# Patient Record
Sex: Male | Born: 1940
Health system: Southern US, Community
[De-identification: ages and names within clinical notes are randomized; demographics above are authoritative.]

## PROBLEM LIST (undated history)

## (undated) DIAGNOSIS — Z8739 Personal history of other diseases of the musculoskeletal system and connective tissue: Secondary | ICD-10-CM

## (undated) DIAGNOSIS — I10 Essential (primary) hypertension: Secondary | ICD-10-CM

## (undated) DIAGNOSIS — Z87442 Personal history of urinary calculi: Secondary | ICD-10-CM

## (undated) DIAGNOSIS — Z95810 Presence of automatic (implantable) cardiac defibrillator: Secondary | ICD-10-CM

## (undated) DIAGNOSIS — E781 Pure hyperglyceridemia: Secondary | ICD-10-CM

## (undated) DIAGNOSIS — E119 Type 2 diabetes mellitus without complications: Secondary | ICD-10-CM

## (undated) DIAGNOSIS — E785 Hyperlipidemia, unspecified: Secondary | ICD-10-CM

## (undated) DIAGNOSIS — M1990 Unspecified osteoarthritis, unspecified site: Secondary | ICD-10-CM

## (undated) DIAGNOSIS — I639 Cerebral infarction, unspecified: Secondary | ICD-10-CM

## (undated) HISTORY — DX: Pure hyperglyceridemia: E78.1

## (undated) HISTORY — PX: INGUINAL HERNIA REPAIR: SUR1180

## (undated) HISTORY — DX: Type 2 diabetes mellitus without complications: E11.9

## (undated) HISTORY — PX: APPENDECTOMY: SHX54

## (undated) HISTORY — DX: Cerebral infarction, unspecified: I63.9

## (undated) HISTORY — PX: CARDIAC CATHETERIZATION: SHX172

## (undated) HISTORY — PX: COLONOSCOPY: SHX174

---

## 1993-06-22 HISTORY — PX: FLEXIBLE SIGMOIDOSCOPY: SHX1649

## 2002-02-23 ENCOUNTER — Encounter: Admission: RE | Admit: 2002-02-23 | Discharge: 2002-02-23 | Payer: Self-pay | Admitting: General Surgery

## 2002-02-23 ENCOUNTER — Encounter: Payer: Self-pay | Admitting: General Surgery

## 2015-02-17 DIAGNOSIS — R42 Dizziness and giddiness: Secondary | ICD-10-CM | POA: Diagnosis not present

## 2015-02-17 DIAGNOSIS — R001 Bradycardia, unspecified: Secondary | ICD-10-CM | POA: Diagnosis not present

## 2015-02-27 DIAGNOSIS — Z79899 Other long term (current) drug therapy: Secondary | ICD-10-CM | POA: Diagnosis not present

## 2015-02-27 DIAGNOSIS — E782 Mixed hyperlipidemia: Secondary | ICD-10-CM | POA: Diagnosis not present

## 2015-02-27 DIAGNOSIS — Z1329 Encounter for screening for other suspected endocrine disorder: Secondary | ICD-10-CM | POA: Diagnosis not present

## 2015-03-07 DIAGNOSIS — R001 Bradycardia, unspecified: Secondary | ICD-10-CM | POA: Diagnosis not present

## 2015-03-07 DIAGNOSIS — I10 Essential (primary) hypertension: Secondary | ICD-10-CM | POA: Diagnosis not present

## 2015-03-07 DIAGNOSIS — E785 Hyperlipidemia, unspecified: Secondary | ICD-10-CM | POA: Diagnosis not present

## 2015-03-26 DIAGNOSIS — R001 Bradycardia, unspecified: Secondary | ICD-10-CM | POA: Diagnosis not present

## 2015-03-26 DIAGNOSIS — R42 Dizziness and giddiness: Secondary | ICD-10-CM | POA: Diagnosis not present

## 2015-03-27 DIAGNOSIS — R001 Bradycardia, unspecified: Secondary | ICD-10-CM | POA: Diagnosis not present

## 2015-04-09 DIAGNOSIS — R001 Bradycardia, unspecified: Secondary | ICD-10-CM | POA: Diagnosis not present

## 2015-04-09 DIAGNOSIS — I493 Ventricular premature depolarization: Secondary | ICD-10-CM | POA: Diagnosis not present

## 2015-08-04 DIAGNOSIS — T63444A Toxic effect of venom of bees, undetermined, initial encounter: Secondary | ICD-10-CM | POA: Diagnosis not present

## 2015-08-28 DIAGNOSIS — Z23 Encounter for immunization: Secondary | ICD-10-CM | POA: Diagnosis not present

## 2015-11-06 DIAGNOSIS — J209 Acute bronchitis, unspecified: Secondary | ICD-10-CM | POA: Diagnosis not present

## 2015-12-03 DIAGNOSIS — Z1389 Encounter for screening for other disorder: Secondary | ICD-10-CM | POA: Diagnosis not present

## 2015-12-03 DIAGNOSIS — Z79899 Other long term (current) drug therapy: Secondary | ICD-10-CM | POA: Diagnosis not present

## 2015-12-03 DIAGNOSIS — Z9181 History of falling: Secondary | ICD-10-CM | POA: Diagnosis not present

## 2015-12-03 DIAGNOSIS — E782 Mixed hyperlipidemia: Secondary | ICD-10-CM | POA: Diagnosis not present

## 2015-12-03 DIAGNOSIS — Z0000 Encounter for general adult medical examination without abnormal findings: Secondary | ICD-10-CM | POA: Diagnosis not present

## 2016-01-16 DIAGNOSIS — J111 Influenza due to unidentified influenza virus with other respiratory manifestations: Secondary | ICD-10-CM | POA: Diagnosis not present

## 2016-07-29 DIAGNOSIS — Z79899 Other long term (current) drug therapy: Secondary | ICD-10-CM | POA: Diagnosis not present

## 2016-07-29 DIAGNOSIS — Z0000 Encounter for general adult medical examination without abnormal findings: Secondary | ICD-10-CM | POA: Diagnosis not present

## 2016-07-29 DIAGNOSIS — S51831A Puncture wound without foreign body of right forearm, initial encounter: Secondary | ICD-10-CM | POA: Diagnosis not present

## 2016-07-29 DIAGNOSIS — E785 Hyperlipidemia, unspecified: Secondary | ICD-10-CM | POA: Diagnosis not present

## 2016-07-29 DIAGNOSIS — E119 Type 2 diabetes mellitus without complications: Secondary | ICD-10-CM | POA: Diagnosis not present

## 2016-09-09 DIAGNOSIS — H5203 Hypermetropia, bilateral: Secondary | ICD-10-CM | POA: Diagnosis not present

## 2016-12-03 DIAGNOSIS — E782 Mixed hyperlipidemia: Secondary | ICD-10-CM | POA: Diagnosis not present

## 2016-12-03 DIAGNOSIS — Z125 Encounter for screening for malignant neoplasm of prostate: Secondary | ICD-10-CM | POA: Diagnosis not present

## 2016-12-03 DIAGNOSIS — Z79899 Other long term (current) drug therapy: Secondary | ICD-10-CM | POA: Diagnosis not present

## 2016-12-03 DIAGNOSIS — Z9181 History of falling: Secondary | ICD-10-CM | POA: Diagnosis not present

## 2016-12-03 DIAGNOSIS — I10 Essential (primary) hypertension: Secondary | ICD-10-CM | POA: Diagnosis not present

## 2017-02-10 DIAGNOSIS — S7001XA Contusion of right hip, initial encounter: Secondary | ICD-10-CM | POA: Diagnosis not present

## 2017-06-10 DIAGNOSIS — H6123 Impacted cerumen, bilateral: Secondary | ICD-10-CM | POA: Diagnosis not present

## 2017-06-10 DIAGNOSIS — M109 Gout, unspecified: Secondary | ICD-10-CM | POA: Diagnosis not present

## 2017-08-29 ENCOUNTER — Other Ambulatory Visit: Payer: Self-pay

## 2017-08-29 NOTE — Patient Outreach (Signed)
[facility]) Care Management  08/29/2017  WALDRON GERRY June 04, 1941 [id number]   Medication Adherence call to Mr. Maxden Naji the reason for this cal is because Mr. Cranston is showing past due under [place] [employer].on his captopril 50 mg and atorvastatin 40 mg spoke to patient he said he still has medication and he does not need any medication at this time.   [place] Management Direct Dial [phone]  Fax [phone] [email or web address]

## 2017-09-29 DIAGNOSIS — H5203 Hypermetropia, bilateral: Secondary | ICD-10-CM | POA: Diagnosis not present

## 2017-09-29 DIAGNOSIS — H2513 Age-related nuclear cataract, bilateral: Secondary | ICD-10-CM | POA: Diagnosis not present

## 2017-10-05 ENCOUNTER — Observation Stay (HOSPITAL_COMMUNITY): Payer: Medicare Other

## 2017-10-05 ENCOUNTER — Encounter (HOSPITAL_COMMUNITY): Payer: Self-pay | Admitting: Emergency Medicine

## 2017-10-05 ENCOUNTER — Inpatient Hospital Stay (HOSPITAL_COMMUNITY)
Admission: EM | Admit: 2017-10-05 | Discharge: 2017-10-07 | DRG: 041 | Disposition: A | Discharge status: Home / Self Care | Payer: Medicare Other | Attending: Family Medicine | Admitting: Family Medicine

## 2017-10-05 ENCOUNTER — Emergency Department (HOSPITAL_COMMUNITY): Payer: Medicare Other

## 2017-10-05 DIAGNOSIS — H547 Unspecified visual loss: Secondary | ICD-10-CM | POA: Diagnosis not present

## 2017-10-05 DIAGNOSIS — H53132 Sudden visual loss, left eye: Secondary | ICD-10-CM

## 2017-10-05 DIAGNOSIS — H5340 Unspecified visual field defects: Secondary | ICD-10-CM | POA: Diagnosis not present

## 2017-10-05 DIAGNOSIS — Z79899 Other long term (current) drug therapy: Secondary | ICD-10-CM | POA: Diagnosis not present

## 2017-10-05 DIAGNOSIS — I10 Essential (primary) hypertension: Secondary | ICD-10-CM | POA: Diagnosis not present

## 2017-10-05 DIAGNOSIS — H5462 Unqualified visual loss, left eye, normal vision right eye: Secondary | ICD-10-CM | POA: Diagnosis present

## 2017-10-05 DIAGNOSIS — I6389 Other cerebral infarction: Secondary | ICD-10-CM | POA: Diagnosis not present

## 2017-10-05 DIAGNOSIS — I63412 Cerebral infarction due to embolism of left middle cerebral artery: Secondary | ICD-10-CM | POA: Diagnosis not present

## 2017-10-05 DIAGNOSIS — I6789 Other cerebrovascular disease: Secondary | ICD-10-CM | POA: Diagnosis not present

## 2017-10-05 DIAGNOSIS — H34212 Partial retinal artery occlusion, left eye: Secondary | ICD-10-CM | POA: Diagnosis not present

## 2017-10-05 DIAGNOSIS — H1010 Acute atopic conjunctivitis, unspecified eye: Secondary | ICD-10-CM | POA: Diagnosis present

## 2017-10-05 DIAGNOSIS — H34232 Retinal artery branch occlusion, left eye: Secondary | ICD-10-CM | POA: Diagnosis present

## 2017-10-05 DIAGNOSIS — I639 Cerebral infarction, unspecified: Secondary | ICD-10-CM

## 2017-10-05 DIAGNOSIS — Z7982 Long term (current) use of aspirin: Secondary | ICD-10-CM | POA: Diagnosis not present

## 2017-10-05 DIAGNOSIS — R29701 NIHSS score 1: Secondary | ICD-10-CM | POA: Diagnosis present

## 2017-10-05 DIAGNOSIS — I6523 Occlusion and stenosis of bilateral carotid arteries: Secondary | ICD-10-CM | POA: Diagnosis not present

## 2017-10-05 DIAGNOSIS — E785 Hyperlipidemia, unspecified: Secondary | ICD-10-CM | POA: Diagnosis present

## 2017-10-05 HISTORY — DX: Personal history of other diseases of the musculoskeletal system and connective tissue: Z87.39

## 2017-10-05 HISTORY — DX: Hyperlipidemia, unspecified: E78.5

## 2017-10-05 HISTORY — DX: Essential (primary) hypertension: I10

## 2017-10-05 HISTORY — DX: Personal history of urinary calculi: Z87.442

## 2017-10-05 LAB — COMPREHENSIVE METABOLIC PANEL
ALBUMIN: 4 g/dL (ref 3.5–5.0)
ALK PHOS: 77 U/L (ref 38–126)
ALT: 32 U/L (ref 17–63)
AST: 36 U/L (ref 15–41)
Anion gap: 6 (ref 5–15)
BILIRUBIN TOTAL: 0.7 mg/dL (ref 0.3–1.2)
BUN: 12 mg/dL (ref 6–20)
CALCIUM: 9 mg/dL (ref 8.9–10.3)
CO2: 26 mmol/L (ref 22–32)
Chloride: 104 mmol/L (ref 101–111)
Creatinine, Ser: 1.23 mg/dL (ref 0.61–1.24)
GFR calc Af Amer: >60 mL/min (ref >60)
GFR calc non Af Amer: 55 mL/min — ABNORMAL LOW (ref >60)
GLUCOSE: 103 mg/dL — AB (ref 65–99)
Potassium: 4.2 mmol/L (ref 3.5–5.1)
Sodium: 136 mmol/L (ref 135–145)
TOTAL PROTEIN: 7 g/dL (ref 6.5–8.1)

## 2017-10-05 LAB — CBC WITH DIFFERENTIAL/PLATELET
BASOS ABS: 0.1 10*3/uL (ref 0.0–0.1)
BASOS PCT: 1 %
Eosinophils Absolute: 0.1 10*3/uL (ref 0.0–0.7)
Eosinophils Relative: 1 %
HEMATOCRIT: 43.1 % (ref 39.0–52.0)
HEMOGLOBIN: 14.4 g/dL (ref 13.0–17.0)
Lymphocytes Relative: 39 %
Lymphs Abs: 2.2 10*3/uL (ref 0.7–4.0)
MCH: 32.1 pg (ref 26.0–34.0)
MCHC: 33.4 g/dL (ref 30.0–36.0)
MCV: 96.2 fL (ref 78.0–100.0)
MONOS PCT: 7 %
Monocytes Absolute: 0.4 10*3/uL (ref 0.1–1.0)
NEUTROS ABS: 2.9 10*3/uL (ref 1.7–7.7)
NEUTROS PCT: 52 %
Platelets: 193 10*3/uL (ref 150–400)
RBC: 4.48 MIL/uL (ref 4.22–5.81)
RDW: 13 % (ref 11.5–15.5)
WBC: 5.6 10*3/uL (ref 4.0–10.5)

## 2017-10-05 LAB — SEDIMENTATION RATE: Sed Rate: 0 mm/hr (ref 0–16)

## 2017-10-05 MED ORDER — ACETAMINOPHEN 325 MG PO TABS: 650.0000 mg | ORAL_TABLET | ORAL | Status: DC | PRN

## 2017-10-05 MED ORDER — OMEGA-3-ACID ETHYL ESTERS 1 G PO CAPS
1.0000 g | ORAL_CAPSULE | Freq: Two times a day (BID) | ORAL | Status: DC
Start: 2017-10-05 — End: 2017-10-07
  Administered 2017-10-05 – 2017-10-07 (×5): 1 g via ORAL
  Filled 2017-10-05 (×6): qty 1

## 2017-10-05 MED ORDER — ATORVASTATIN CALCIUM 40 MG PO TABS
40.0000 mg | ORAL_TABLET | Freq: Every day | ORAL | Status: DC
  Administered 2017-10-05 – 2017-10-07 (×3): 40 mg via ORAL
  Filled 2017-10-05 (×3): qty 1

## 2017-10-05 MED ORDER — IOPAMIDOL (ISOVUE-370) INJECTION 76%
INTRAVENOUS | Status: AC
  Administered 2017-10-05: 50 mL
  Filled 2017-10-05: qty 50

## 2017-10-05 MED ORDER — IOPAMIDOL (ISOVUE-370) INJECTION 76%
INTRAVENOUS | Status: AC
  Filled 2017-10-05: qty 50

## 2017-10-05 MED ORDER — ASPIRIN 325 MG PO TABS
325.0000 mg | ORAL_TABLET | Freq: Every day | ORAL | Status: DC
  Administered 2017-10-05 – 2017-10-06 (×2): 325 mg via ORAL
  Filled 2017-10-05 (×2): qty 1

## 2017-10-05 MED ORDER — KETOROLAC TROMETHAMINE 0.5 % OP SOLN
1.0000 [drp] | Freq: Four times a day (QID) | OPHTHALMIC | Status: DC
  Administered 2017-10-05 – 2017-10-07 (×7): 1 [drp] via OPHTHALMIC
  Filled 2017-10-05: qty 3

## 2017-10-05 MED ORDER — ACETAMINOPHEN 160 MG/5ML PO SOLN: 650.0000 mg | ORAL | Status: DC | PRN

## 2017-10-05 MED ORDER — ENOXAPARIN SODIUM 40 MG/0.4ML ~~LOC~~ SOLN
40.0000 mg | SUBCUTANEOUS | Status: DC
  Administered 2017-10-06: 40 mg via SUBCUTANEOUS
  Filled 2017-10-05 (×2): qty 0.4

## 2017-10-05 MED ORDER — ACETAMINOPHEN 650 MG RE SUPP: 650.0000 mg | RECTAL | Status: DC | PRN

## 2017-10-05 MED ORDER — ASPIRIN 300 MG RE SUPP: 300.0000 mg | Freq: Every day | RECTAL | Status: DC

## 2017-10-05 MED ORDER — HYDRALAZINE HCL 20 MG/ML IJ SOLN: 10.0000 mg | Freq: Four times a day (QID) | INTRAMUSCULAR | Status: DC | PRN

## 2017-10-05 MED ORDER — STROKE: EARLY STAGES OF RECOVERY BOOK
Freq: Once | Status: AC
  Administered 2017-10-05: 1
  Filled 2017-10-05: qty 1

## 2017-10-05 MED ORDER — POLYVINYL ALCOHOL 1.4 % OP SOLN
1.0000 [drp] | Freq: Four times a day (QID) | OPHTHALMIC | Status: DC | PRN
  Filled 2017-10-05: qty 15

## 2017-10-05 NOTE — Evaluation (Addendum)
Physical Therapy Evaluation/Discharge  Patient Details Name: Zachary Calhoun MRN: [id number] DOB: [birth year] Today's Date: 10/05/2017   History of Present Illness  76 y.o. male admitted on 10/05/17 for sudden vision changes in his left eye.  CT negative for acute intracrania abnormality.  MRI pending. Pt with significant PMH of HTN and HLD.    Clinical Impression  Pt is independent with all gait and mobility including stair training.  Pt's left eye visual deficits do not seem to affect his balance or his ability to avoid left sided obstacles or do stairs safely.  At this time he has no acute PT needs or f/u needs.  PT to sign off. BEFAST stroke education completed.      Follow Up Recommendations No PT follow up    Equipment Recommendations  None recommended by PT    Recommendations for Other Services  NA     Precautions / Restrictions Precautions Precautions: None      Mobility  Bed Mobility Overal bed mobility: Independent                Transfers Overall transfer level: Independent                  Ambulation/Gait Ambulation/Gait assistance: Independent Ambulation Distance (Feet): 300 Feet Assistive device: None Gait Pattern/deviations: WFL(Within Functional Limits)   Gait velocity interpretation: at or above normal speed for age/gender    Stairs Stairs: Yes Stairs assistance: Independent Stair Management: No rails;Alternating pattern Number of Stairs: 10 General stair comments: Independent, no problems (seemingly) with depth perception or safety on the stairs.    Modified Rankin (Stroke Patients Only) Modified Rankin (Stroke Patients Only) Pre-Morbid Rankin Score: No symptoms Modified Rankin: No significant disability     Balance Overall balance assessment: Independent                               Standardized Balance Assessment Standardized Balance Assessment : Dynamic Gait Index   Dynamic Gait Index Level Surface:  Normal Change in Gait Speed: Normal Gait with Horizontal Head Turns: Normal Gait with Vertical Head Turns: Normal Gait and Pivot Turn: Normal Step Over Obstacle: Normal Step Around Obstacles: Normal Steps: Normal Total Score: 24       Pertinent Vitals/Pain Pain Assessment: No/denies pain    Home Living Family/patient expects to be discharged to:: Private residence Living Arrangements: Spouse/significant other Available Help at Discharge: Family;Available 24 hours/day Type of Home: House                Prior Function Level of Independence: Independent         Comments: he is retired from his professional job, 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       Hand Dominance   Dominant Hand: Right    Extremity/Trunk Assessment   Upper Extremity Assessment Upper Extremity Assessment: Overall WFL for tasks assessed    Lower Extremity Assessment Lower Extremity Assessment: Overall WFL for tasks assessed (strength, sensation, coordination testing WNL)    Cervical / Trunk Assessment Cervical / Trunk Assessment: Normal  Communication   Communication: No difficulties  Cognition Arousal/Alertness: Awake/alert Behavior During Therapy: WFL for tasks assessed/performed Overall Cognitive Status: Within Functional Limits for tasks assessed  General Comments General comments (skin integrity, edema, etc.): Left eye superior vision deficits.         Assessment/Plan    PT Assessment Patent does not need any further PT services         PT Goals (Current goals can be found in the Care Plan section)  Acute Rehab PT Goals PT Goal Formulation: All assessment and education complete, DC therapy               AM-PAC PT "6 Clicks" Daily Activity  Outcome Measure Difficulty turning over in bed (including adjusting bedclothes, sheets and blankets)?: None Difficulty moving from lying on back to sitting on  the side of the bed? : None Difficulty sitting down on and standing up from a chair with arms (e.g., wheelchair, bedside commode, etc,.)?: None Help needed moving to and from a bed to chair (including a wheelchair)?: None Help needed walking in hospital room?: None Help needed climbing 3-5 steps with a railing? : None 6 Click Score: 24    End of Session   Activity Tolerance: Patient tolerated treatment well Patient left: in bed;with call bell/phone within reach;with family/visitor present Nurse Communication: Mobility status;Other (comment) (can be independent in room) PT Visit Diagnosis: Other symptoms and signs involving the nervous system (E36.629)    Time: 4765-4650 PT Time Calculation (min) (ACUTE ONLY): 32 min   Charges:   PT Evaluation $PT Eval Low Complexity: 1 Low PT Treatments $Gait Training: 8-22 mins   PT G Codes:            Heydy Montilla B. Helena Sardo, PT, DPT [phone]   PT G-Codes **NOT FOR INPATIENT CLASS** Functional Assessment Tool Used: AM-PAC 6 Clicks Basic Mobility Functional Limitation: Mobility: Walking and moving around Mobility: Walking and Moving Around Current Status (L2751): 0 percent impaired, limited or restricted Mobility: Walking and Moving Around Goal Status (Z0017): 0 percent impaired, limited or restricted Mobility: Walking and Moving Around Discharge Status [phone]): 0 percent impaired, limited or restricted    10/05/2017, 5:58 PM

## 2017-10-05 NOTE — ED Notes (Signed)
Hospitalist at bedside 

## 2017-10-05 NOTE — H&P (Signed)
History and Physical    Zachary Calhoun YTW:[id number] DOB: [birth year] DOA: 10/05/2017   PCP: [facility]   Attending physician: Marily Memos  Patient coming from/Resides with: Private residence  Chief Complaint: Acute vision loss left eye  HPI: MARE Zachary Calhoun is a 76 y.o. male with medical history significant for hypertension, dyslipidemia who presents to the ER after developing acute left eye vision loss around 8:30 PM on 10/30.  He had initially been evaluated by his ophthalmologist at [facility] today and due to concerns of possible retinal artery occlusion he was sent to the ER for further evaluation and treatment.  In the ER initial CTA of the head and neck was negative for acute stroke without any notable carotid or vertebral artery occlusion, dissection or aneurysm.  There was dense calcification of the right vertebral artery origin with at least moderate underlying stenosis.  Patient was subsequently evaluated by neurology and was found to have left medial inferior quadrantanopsia with abnormalities visualized on the dilated exam from his ophthalmologist consistent with likely retinal artery occlusion.  Full stroke workup indicated to rule out carotid versus cardiac abnormalities.  Additional history obtained from patient's daughter who reports sometime in the past week or 2 patient had unexplained numbness and tingling of the lips that lasted for several minutes then resolved and has not recurred.  He did not have any associated facial drooping, visual disturbance or difficulty with speech at that time.  ED Course:  Vital Signs: BP 135/85   Pulse (!) 59   Temp 98 F (36.7 C)   Resp 13   SpO2 95%  CT, CTA head and neck: as above Lab data: Sodium 136, potassium 4.2, chloride 104, CO2 26, glucose 103, BUN 12, creatinine 1.23, LFTs unremarkable, white count 5600 with normal differential, hemoglobin 14.4, platelets 193,000, ESR 0 Medications and treatments:  Aspirin 325 mg x1  Review of Systems:  In addition to the HPI above,  No Fever-chills, myalgias or other constitutional symptoms No Headache, changes with Vision or hearing, new weakness, tingling, numbness in any extremity, dizziness, dysarthria or word finding difficulty, gait disturbance or imbalance, tremors or seizure activity No problems swallowing food or Liquids, indigestion/reflux, choking or coughing while eating, abdominal pain with or after eating No Chest pain, Cough or Shortness of Breath, palpitations, orthopnea or DOE No Abdominal pain, N/V, melena,hematochezia, dark tarry stools, constipation No dysuria, malodorous urine, hematuria or flank pain No new skin rashes, lesions, masses or bruises, No new joint pains, aches, swelling or redness No recent unintentional weight gain or loss No polyuria, polydypsia or polyphagia   Past Medical History:  Diagnosis Date  . HLD (hyperlipidemia)   . Hypertension     History reviewed. No pertinent surgical history.  Social History   Social History  . Marital status: Legally Separated    Spouse name: N/A  . Number of children: N/A  . Years of education: N/A   Occupational History  . Not on file.   Social History Main Topics  . Smoking status: Never Smoker  . Smokeless tobacco: Never Used  . Alcohol use No  . Drug use: No  . Sexual activity: Not on file   Other Topics Concern  . Not on file   Social History Narrative  . No narrative on file    Mobility: Independent Work history: Retired Holiday representative   No Known Allergies  Family history reviewed and no reported family history  of stroke  Prior to Admission medications   Medication Sig Start Date End Date Taking? Authorizing Provider  aspirin EC 81 MG tablet Take 81 mg by mouth daily.   Yes [provider]  atorvastatin (LIPITOR) 40 MG tablet Take 40 mg by mouth daily. 09/09/17  Yes [provider]  captopril (CAPOTEN) 50 MG tablet  Take 50 mg by mouth 2 (two) times daily. 08/29/17  Yes [provider]  Omega-3 Fatty Acids (FISH OIL) 1000 MG CAPS Take 1,000 mg by mouth daily.   Yes [provider]    Physical Exam: Vitals:   10/05/17 1300 10/05/17 1345 10/05/17 1415 10/05/17 1445  BP: (!) 146/70 130/77 128/64 135/85  Pulse: (!) 58 (!) 126 60 (!) 59  Resp: 16 19 15 13   Temp:      SpO2: 96% 99% 97% 95%      Constitutional: NAD, calm, comfortable Eyes: PERRL, lids normal in appearance except for decreased lashes on lower lid, bilateral conjunctiva appear slightly dry and reddened with outer canthus left eye more reddened and concerning for possible evolving conjunctivitis ENMT: Mucous membranes are moist. Posterior pharynx clear of any exudate or lesions. Normal dentition.  Neck: normal, supple, no masses, no thyromegaly Respiratory: clear to auscultation bilaterally, no wheezing, no crackles. Normal respiratory effort. No accessory muscle use.  Cardiovascular: Regular rate and rhythm, no murmurs / rubs / gallops. No extremity edema. 2+ pedal pulses. No carotid bruits.  Abdomen: no tenderness, no masses palpated. No hepatosplenomegaly. Bowel sounds positive.  Musculoskeletal: no clubbing / cyanosis. No joint deformity upper and lower extremities. Good ROM, no contractures. Normal muscle tone.  Skin: no rashes, lesions, ulcers. No induration Neurologic: CN 2-12 grossly intact except for documented decreased vision left eye (upper outer quadrant). Sensation intact, DTR normal. Strength 5/5 x all 4 extremities.  Psychiatric: Normal judgment and insight. Alert and oriented x 3. Normal mood.    Labs on Admission: I have personally reviewed following labs and imaging studies  CBC:  Recent Labs Lab 10/05/17 1148  WBC 5.6  NEUTROABS 2.9  HGB 14.4  HCT 43.1  MCV 96.2  PLT 194   Basic Metabolic Panel:  Recent Labs Lab 10/05/17 1148  NA 136  K 4.2  CL 104  CO2 26  GLUCOSE 103*  BUN 12    CREATININE 1.23  CALCIUM 9.0   GFR: CrCl cannot be calculated (Unknown ideal weight.). Liver Function Tests:  Recent Labs Lab 10/05/17 1148  AST 36  ALT 32  ALKPHOS 77  BILITOT 0.7  PROT 7.0  ALBUMIN 4.0   No results for input(s): LIPASE, AMYLASE in the last 168 hours. No results for input(s): AMMONIA in the last 168 hours. Coagulation Profile: No results for input(s): INR, PROTIME in the last 168 hours. Cardiac Enzymes: No results for input(s): CKTOTAL, CKMB, CKMBINDEX, TROPONINI in the last 168 hours. BNP (last 3 results) No results for input(s): PROBNP in the last 8760 hours. HbA1C: No results for input(s): HGBA1C in the last 72 hours. CBG: No results for input(s): GLUCAP in the last 168 hours. Lipid Profile: No results for input(s): CHOL, HDL, LDLCALC, TRIG, CHOLHDL, LDLDIRECT in the last 72 hours. Thyroid Function Tests: No results for input(s): TSH, T4TOTAL, FREET4, T3FREE, THYROIDAB in the last 72 hours. Anemia Panel: No results for input(s): VITAMINB12, FOLATE, FERRITIN, TIBC, IRON, RETICCTPCT in the last 72 hours. Urine analysis: No results found for: COLORURINE, APPEARANCEUR, LABSPEC, [place], GLUCOSEU, HGBUR, BILIRUBINUR, KETONESUR, PROTEINUR, UROBILINOGEN, NITRITE, LEUKOCYTESUR Sepsis Labs: @  LABRCNTIP(procalcitonin:4,lacticidven:4) )No results found for this or any previous visit (from the past 240 hour(s)).   Radiological Exams on Admission: Ct Angio Head W Or Wo Contrast  Result Date: 10/05/2017 CLINICAL DATA:  76 y/o  M; retinal vascular occlusion. EXAM: CT ANGIOGRAPHY HEAD AND NECK TECHNIQUE: Multidetector CT imaging of the head and neck was performed using the standard protocol during bolus administration of intravenous contrast. Multiplanar CT image reconstructions and MIPs were obtained to evaluate the vascular anatomy. Carotid stenosis measurements (when applicable) are obtained utilizing NASCET criteria, using the distal internal carotid diameter as  the denominator. CONTRAST:  100 cc Isovue 370 COMPARISON:  None. FINDINGS: CT HEAD FINDINGS Brain: No evidence of acute infarction, hemorrhage, hydrocephalus, extra-axial collection or mass lesion/mass effect. Few nonspecific foci of hypoattenuation are present in subcortical and periventricular white matter compatible with mild chronic microvascular ischemic changes. Vascular: As below. Skull: Normal. Negative for fracture or focal lesion. Sinuses: Mastoid tip sclerosis probably representing sequelae of chronic otomastoiditis. Otherwise negative. Orbits: No acute finding. Review of the MIP images confirms the above findings CTA NECK FINDINGS Aortic arch: Standard branching. Imaged portion shows no evidence of aneurysm or dissection. No significant stenosis of the major arch vessel origins. Mild calcific atherosclerosis. Right carotid system: No evidence of dissection, stenosis (50% or greater) or occlusion. Mild calcific atherosclerosis of carotid bifurcation. Left carotid system: No evidence of dissection, stenosis (50% or greater) or occlusion. Mild calcific atherosclerosis of carotid bifurcation. Vertebral arteries: Right dominant. Dense calcification of right vertebral artery origin with a least moderate underlying stenosis, accurate assessment of luminal stenosis is suboptimal given dense calcification. Skeleton: Moderate multilevel cervical spondylosis with disc and facet degenerative changes. No high-grade bony canal stenosis. Other neck: Negative. Upper chest: Negative. Review of the MIP images confirms the above findings CTA HEAD FINDINGS Anterior circulation: No significant stenosis, proximal occlusion, aneurysm, or vascular malformation. Calcified plaque of right-greater-than-left carotid siphons. Posterior circulation: No significant stenosis, proximal occlusion, aneurysm, or vascular malformation. Venous sinuses: As permitted by contrast timing, patent. Anatomic variants: Patent anterior communicating  artery and right larger than left posterior communicating arteries. Delayed phase: No abnormal intracranial enhancement. Review of the MIP images confirms the above findings IMPRESSION: CT head: 1. No acute intracranial abnormality.  No abnormal enhancement. 2. Mild chronic microvascular ischemic changes and parenchymal volume loss of the brain. 3. No orbital mass or inflammatory process identified. CTA neck: 1. Patent carotid and vertebral arteries. No occlusion, dissection, or aneurysm. 2. Dense calcification of right vertebral artery origin with a least moderate underlying stenosis, accurate assessment of luminal stenosis is suboptimal given dense calcification. 3. Otherwise no significant stenosis by NASCET criteria. 4. Mild calcific atherosclerosis of aortic arch and bilateral carotid bifurcations. CTA head: 1. Patent circle of Willis. No large vessel occlusion, aneurysm, or significant stenosis is identified. 2. Mild calcific atherosclerosis of carotid siphons. Electronically Signed   By: Kristine Garbe M.D.   On: 10/05/2017 13:49   Ct Angio Neck W And/or Wo Contrast  Result Date: 10/05/2017 CLINICAL DATA:  76 y/o  M; retinal vascular occlusion. EXAM: CT ANGIOGRAPHY HEAD AND NECK TECHNIQUE: Multidetector CT imaging of the head and neck was performed using the standard protocol during bolus administration of intravenous contrast. Multiplanar CT image reconstructions and MIPs were obtained to evaluate the vascular anatomy. Carotid stenosis measurements (when applicable) are obtained utilizing NASCET criteria, using the distal internal carotid diameter as the denominator. CONTRAST:  100 cc Isovue 370 COMPARISON:  None. FINDINGS: CT HEAD FINDINGS  Brain: No evidence of acute infarction, hemorrhage, hydrocephalus, extra-axial collection or mass lesion/mass effect. Few nonspecific foci of hypoattenuation are present in subcortical and periventricular white matter compatible with mild chronic  microvascular ischemic changes. Vascular: As below. Skull: Normal. Negative for fracture or focal lesion. Sinuses: Mastoid tip sclerosis probably representing sequelae of chronic otomastoiditis. Otherwise negative. Orbits: No acute finding. Review of the MIP images confirms the above findings CTA NECK FINDINGS Aortic arch: Standard branching. Imaged portion shows no evidence of aneurysm or dissection. No significant stenosis of the major arch vessel origins. Mild calcific atherosclerosis. Right carotid system: No evidence of dissection, stenosis (50% or greater) or occlusion. Mild calcific atherosclerosis of carotid bifurcation. Left carotid system: No evidence of dissection, stenosis (50% or greater) or occlusion. Mild calcific atherosclerosis of carotid bifurcation. Vertebral arteries: Right dominant. Dense calcification of right vertebral artery origin with a least moderate underlying stenosis, accurate assessment of luminal stenosis is suboptimal given dense calcification. Skeleton: Moderate multilevel cervical spondylosis with disc and facet degenerative changes. No high-grade bony canal stenosis. Other neck: Negative. Upper chest: Negative. Review of the MIP images confirms the above findings CTA HEAD FINDINGS Anterior circulation: No significant stenosis, proximal occlusion, aneurysm, or vascular malformation. Calcified plaque of right-greater-than-left carotid siphons. Posterior circulation: No significant stenosis, proximal occlusion, aneurysm, or vascular malformation. Venous sinuses: As permitted by contrast timing, patent. Anatomic variants: Patent anterior communicating artery and right larger than left posterior communicating arteries. Delayed phase: No abnormal intracranial enhancement. Review of the MIP images confirms the above findings IMPRESSION: CT head: 1. No acute intracranial abnormality.  No abnormal enhancement. 2. Mild chronic microvascular ischemic changes and parenchymal volume loss of  the brain. 3. No orbital mass or inflammatory process identified. CTA neck: 1. Patent carotid and vertebral arteries. No occlusion, dissection, or aneurysm. 2. Dense calcification of right vertebral artery origin with a least moderate underlying stenosis, accurate assessment of luminal stenosis is suboptimal given dense calcification. 3. Otherwise no significant stenosis by NASCET criteria. 4. Mild calcific atherosclerosis of aortic arch and bilateral carotid bifurcations. CTA head: 1. Patent circle of Willis. No large vessel occlusion, aneurysm, or significant stenosis is identified. 2. Mild calcific atherosclerosis of carotid siphons. Electronically Signed   By: Kristine Garbe M.D.   On: 10/05/2017 13:49    EKG: (Independently reviewed) sinus rhythm with borderline bradycardic rate 58 bpm-possible sinus arrhythmia versus PAC, QTC 403 ms, normal R wave rotation, no acute ischemic changes  Assessment/Plan Principal Problem:   Acute loss of vision, left -Patient presents to ER after > 12 hours vision loss-initially seen by ophthalmologist who detected arterial occlusions on dilated exam and sent patient immediately to ER concerning for focal embolic retinal artery stroke -No other neurological deficits appreciated -Neurology following -MRI brain pending-uncertain significance of right vertebral artery moderate stenosis with heavy calcification seen on CTA head and neck -NIH Stroke Scale Calculator 1.  a. Level of consciousness: Alert (0 points)     b. Patient asked current month and age: Answers both correctly (0 points)     c. Patient asked to open And close eyes: Obeys both correctly (0 points) 2.  Best gaze: Normal (0 points) 3.  Visual field testing: Partial hemianopia (1 point) 4.  Facial paresis: Normal symmetrical movement (0 points) 5.  a. Motor function - left arm: Normal (0 points)     b. Motor function - right arm: Normal (0 points) 6.  a. Motor function - left leg: Normal (0  points)  b. Motor function - right leg: Normal (0 points) 7.  Limb ataxia: No ataxia (0 points) 8.  Sensory: Normal (0 points) 9.  Best language: No aphasia (0 points) 10. Dysarthria: Normal articulation (0 points) 11. Exinction and inattention: Normal (0 points) Total: 1 point. -Echocardiogram -On aspirin 81 mg prior to admission-increased to 325 mg -FLP/HgbA1c -PT/OT/SLP evaluation -Frequent neurological checks -Telemetry monitoring to rule out underlying arrhythmia such as atrial fibrillation as contributing factor  Active Problems:   Hypertension -In the context of possible acute stroke will allow for permissive hypertension -IV hydralazine prn SBP >/= 210 or DBP >/= 110    HLD (hyperlipidemia) -Continue preadmission Lipitor and omega-3 fatty acids -Defer to neurology whether Lipitor needs to be increased to 80 mg daily    Allergic conjunctivitis -Visine/Tetrahydrolozine -Ketorolac ophthalmic 1 drop OU QID       DVT prophylaxis: Lovenox Code Status: Full Family Communication: Daughter and wife at bedside Disposition Plan: Home Consults called: Neurology/Arora    Samella Parr ANP-BC [facility] Pager [phone]   If 7PM-7AM, please contact night-coverage [email or web address] Password [email or web address]  10/05/2017, 2:59 PM

## 2017-10-05 NOTE — ED Triage Notes (Signed)
Pt arrives via EMS from [facility] with complaints of left eye visual disturbances that began at 8:30 pm last night. Pt went to eye doctor for evaluation and sent to ER for concerns of blood clots in the eye that could have artery involvement. Pt denies pain.

## 2017-10-05 NOTE — Consult Note (Signed)
Requesting Physician: Dr. Jeneen Rinks    Chief Complaint: Left eye visual loss and concerns for retinal artery occlusion  History obtained from:  Patient    HPI:                                                                                                                                         Zachary Calhoun is an 76 y.o. male with past medical history of hyperlipidemia and hypertension.  Patient states that last night he was watching TV when suddenly around 8:30 PM he noted that he could not see out of his left eye especially in the medial lower aspect.  Patient did go to his eye doctor who did a dilated exam and noted that there were some arterial occlusions thus sent him to the emergency department.  Currently patient has no other symptoms other than decreased to no vision in the lower medial aspect of his left eye.  For this reason he will need to be admitted and have a stroke workup.  Date last known well: Date: 10/04/2017 Time last known well: Time: 20:30 tPA Given: No: Out of the window for TPA NIHSSstroke scale of 1  Modified Rankin: Rankin Score=0   Past Medical History:  Diagnosis Date  . HLD (hyperlipidemia)   . Hypertension     History reviewed. No pertinent surgical history.  No family history on file. Social History:  reports that he has never smoked. He has never used smokeless tobacco. He reports that he does not drink alcohol or use drugs.  Allergies: No Known Allergies  Medications:                                                                                                                           Current Facility-Administered Medications  Medication Dose Route Frequency Provider Last Rate Last Dose  . iopamidol (ISOVUE-370) 76 % injection            Current Outpatient Prescriptions  Medication Sig Dispense Refill  . aspirin EC 81 MG tablet Take 81 mg by mouth daily.    Marland Kitchen atorvastatin (LIPITOR) 40 MG tablet Take 40 mg by mouth daily.    . captopril  (CAPOTEN) 50 MG tablet Take 50 mg by mouth 2 (two) times daily.    . Omega-3 Fatty Acids (FISH OIL) 1000 MG CAPS Take 1,000 mg by mouth  daily.       ROS:                                                                                                                                       History obtained from the patient  General ROS: negative for - chills, fatigue, fever, night sweats, weight gain or weight loss Psychological ROS: negative for - behavioral disorder, hallucinations, memory difficulties, mood swings or suicidal ideation Ophthalmic ROS: Positive for -  loss of vision ENT ROS: negative for - epistaxis, nasal discharge, oral lesions, sore throat, tinnitus or vertigo Allergy and Immunology ROS: negative for - hives or itchy/watery eyes Hematological and Lymphatic ROS: negative for - bleeding problems, bruising or swollen lymph nodes Endocrine ROS: negative for - galactorrhea, hair pattern changes, polydipsia/polyuria or temperature intolerance Respiratory ROS: negative for - cough, hemoptysis, shortness of breath or wheezing Cardiovascular ROS: negative for - chest pain, dyspnea on exertion, edema or irregular heartbeat Gastrointestinal ROS: negative for - abdominal pain, diarrhea, hematemesis, nausea/vomiting or stool incontinence Genito-Urinary ROS: negative for - dysuria, hematuria, incontinence or urinary frequency/urgency Musculoskeletal ROS: negative for - joint swelling or muscular weakness Neurological ROS: as noted in HPI Dermatological ROS: negative for rash and skin lesion changes  Neurologic Examination:                                                                                                      Blood pressure (!) 144/89, pulse 62, temperature 98 F (36.7 C), resp. rate 20, SpO2 96 %.  HEENT-  Normocephalic, no lesions, without obvious abnormality.  Normal external eye and conjunctiva.  Normal TM's bilaterally.  Normal auditory canals and external ears.  Normal external nose, mucus membranes and septum.  Normal pharynx.  Funduscopic exam shows possible shiny plaque in at least 2 branches in the inferior to the disc. Cardiovascular- S1, S2 normal, pulses palpable throughout   Lungs- chest clear, no wheezing, rales, normal symmetric air entry Abdomen- normal findings: bowel sounds normal Extremities- no edema Lymph-no adenopathy palpable Musculoskeletal-no joint tenderness, deformity or swelling Skin-warm and dry, no hyperpigmentation, vitiligo, or suspicious lesions  Neurological Examination Mental Status: Alert, oriented, thought content appropriate.  Speech fluent without evidence of aphasia.  Able to follow 3 step commands without difficulty. Cranial Nerves: II: Discs do show what looks like small arterial occlusions in the left eye; positive medial inferior quadrantanopsia III,IV, VI: ptosis not present, extra-ocular motions intact bilaterally, pupils equal, round, reactive to light and accommodation  V,VII: smile symmetric, facial light touch sensation normal bilaterally VIII: hearing normal bilaterally IX,X: uvula rises symmetrically XI: bilateral shoulder shrug XII: midline tongue extension Motor: Right : Upper extremity   5/5    Left:     Upper extremity   5/5  Lower extremity   5/5     Lower extremity   5/5 Tone and bulk:normal tone throughout; no atrophy noted Sensory: Pinprick and light touch intact throughout, bilaterally Deep Tendon Reflexes: 2+ and symmetric throughout Plantars: Right: downgoing   Left: downgoing Cerebellar: normal finger-to-nose, normal rapid alternating movements and normal heel-to-shin test Gait: Not tested    Lab Results: CBC:  Recent Labs Lab 10/05/17 1148  WBC 5.6  NEUTROABS 2.9  HGB 14.4  HCT 43.1  MCV 96.2  PLT 193   Imaging: No results found.  Assessment and plan discussed with with attending physician and they are in agreement.    Etta Quill PA-C [facility] [phone]  10/05/2017, 12:34 PM    Attending Neurohospitalist Addendum Patient seen and examined. Agree with the history and physical as documented above. Agree with the plan as documented, which I helped formulate. I have independently obtaining history, and review of systems on my exam and the patient and reviewed the chart including pertinent labs and pertinent imaging.   Assessment: 76 y.o. male presenting to the emergency department with sudden onset of left medial inferior quadrantanopsia and abnormalities visualized in the dilated exam from his ophthalmologist.  Most likely retinal artery occlusion.  Patient is in normal sinus rhythm and thus will need a full stroke workup to evaluate for carotid abnormalities versus cardiac abnormalities.  Stroke Risk Factors - hyperlipidemia and hypertension  Recommend 1. HgbA1c, fasting lipid panel 2. MRI of the brain without contrast/ CTA head and neck 3. PT consult, OT consult, Speech consult 4. Echocardiogram 5. 80 mg of Atorvistatin 6. Prophylactic therapy-Antiplatelet med: Aspirin 325 mg daily 7. Risk factor modification 8. Telemetry monitoring 9. Frequent neuro checks 10 NPO until passes stroke swallow screen 11 please page stroke NP  Or  PA  Or MD from 8am -4 pm  as this patient from this time will be  followed by the stroke.   You can look them up on [email or web address]  Password [email or web address]    --- Amie Portland, MD [facility] [phone]  If 7pm to 7am, please call on call as listed on AMION.

## 2017-10-05 NOTE — ED Notes (Signed)
Patient transported to CT 

## 2017-10-06 ENCOUNTER — Observation Stay (HOSPITAL_COMMUNITY): Payer: Medicare Other

## 2017-10-06 DIAGNOSIS — H5340 Unspecified visual field defects: Secondary | ICD-10-CM | POA: Diagnosis present

## 2017-10-06 DIAGNOSIS — I63412 Cerebral infarction due to embolism of left middle cerebral artery: Secondary | ICD-10-CM | POA: Diagnosis not present

## 2017-10-06 DIAGNOSIS — I071 Rheumatic tricuspid insufficiency: Secondary | ICD-10-CM | POA: Diagnosis not present

## 2017-10-06 DIAGNOSIS — R29701 NIHSS score 1: Secondary | ICD-10-CM | POA: Diagnosis not present

## 2017-10-06 DIAGNOSIS — H53132 Sudden visual loss, left eye: Secondary | ICD-10-CM | POA: Diagnosis not present

## 2017-10-06 DIAGNOSIS — I639 Cerebral infarction, unspecified: Secondary | ICD-10-CM

## 2017-10-06 DIAGNOSIS — I10 Essential (primary) hypertension: Secondary | ICD-10-CM | POA: Diagnosis not present

## 2017-10-06 DIAGNOSIS — I6389 Other cerebral infarction: Secondary | ICD-10-CM | POA: Diagnosis not present

## 2017-10-06 DIAGNOSIS — H1010 Acute atopic conjunctivitis, unspecified eye: Secondary | ICD-10-CM | POA: Diagnosis not present

## 2017-10-06 DIAGNOSIS — I371 Nonrheumatic pulmonary valve insufficiency: Secondary | ICD-10-CM | POA: Diagnosis not present

## 2017-10-06 DIAGNOSIS — Z7982 Long term (current) use of aspirin: Secondary | ICD-10-CM | POA: Diagnosis not present

## 2017-10-06 DIAGNOSIS — H5462 Unqualified visual loss, left eye, normal vision right eye: Secondary | ICD-10-CM | POA: Diagnosis present

## 2017-10-06 DIAGNOSIS — E785 Hyperlipidemia, unspecified: Secondary | ICD-10-CM | POA: Diagnosis not present

## 2017-10-06 DIAGNOSIS — Z79899 Other long term (current) drug therapy: Secondary | ICD-10-CM | POA: Diagnosis not present

## 2017-10-06 DIAGNOSIS — H34232 Retinal artery branch occlusion, left eye: Secondary | ICD-10-CM | POA: Diagnosis not present

## 2017-10-06 LAB — ECHOCARDIOGRAM COMPLETE
CHL CUP DOP CALC LVOT VTI: 30 cm
CHL CUP RV SYS PRESS: 31 mmHg
CHL CUP STROKE VOLUME: 53 mL
CHL CUP TV REG PEAK VELOCITY: 266 cm/s
E decel time: 222 msec
EERAT: 12.28
FS: 35 % (ref 28–44)
IVS/LV PW RATIO, ED: 0.94
LA ID, A-P, ES: 33 mm
LA vol A4C: 40.7 ml
LA vol: 52.4 mL
LDCA: 3.14 cm2
LEFT ATRIUM END SYS DIAM: 33 mm
LV E/e'average: 12.28
LV TDI E'LATERAL: 7.62
LV TDI E'MEDIAL: 6.42
LV dias vol: 104 mL (ref 62–150)
LV sys vol: 52 mL (ref 21–61)
LVEEMED: 12.28
LVELAT: 7.62 cm/s
LVOT diameter: 20 mm
LVOT peak grad rest: 5 mmHg
LVOTPV: 110 cm/s
LVOTSV: 94 mL
Lateral S' vel: 13.2 cm/s
MV Dec: 222
MV pk A vel: 92.2 m/s
MV pk E vel: 93.6 m/s
MVPG: 4 mmHg
PW: 9.66 mm — AB (ref 0.6–1.1)
Simpson's disk: 51
TAPSE: 21.6 mm
TR max vel: 266 cm/s

## 2017-10-06 LAB — HEMOGLOBIN A1C
Hgb A1c MFr Bld: 5.9 % — ABNORMAL HIGH (ref 4.8–5.6)
MEAN PLASMA GLUCOSE: 122.63 mg/dL

## 2017-10-06 LAB — LIPID PANEL
CHOL/HDL RATIO: 3.5 ratio
CHOLESTEROL: 87 mg/dL (ref 0–200)
HDL: 25 mg/dL — ABNORMAL LOW (ref >40)
LDL Cholesterol: 28 mg/dL (ref 0–99)
Triglycerides: 171 mg/dL — ABNORMAL HIGH (ref <150)
VLDL: 34 mg/dL (ref 0–40)

## 2017-10-06 MED ORDER — CLOPIDOGREL BISULFATE 75 MG PO TABS
75.0000 mg | ORAL_TABLET | Freq: Every day | ORAL | Status: DC
  Administered 2017-10-06 – 2017-10-07 (×2): 75 mg via ORAL
  Filled 2017-10-06 (×2): qty 1

## 2017-10-06 NOTE — Evaluation (Signed)
Speech Language Pathology Evaluation Patient Details Name: Zachary Calhoun MRN: [id number] DOB: [birth year] Today's Date: 10/06/2017 Time: 6759-1638 SLP Time Calculation (min) (ACUTE ONLY): 9 min  Problem List:  Patient Active Problem List   Diagnosis Date Noted  . Ischemic stroke ([place])   . Acute loss of vision, left 10/05/2017  . HLD (hyperlipidemia) 10/05/2017  . Hypertension 10/05/2017   Past Medical History:  Past Medical History:  Diagnosis Date  . History of gout X 1   "left big toe"  . History of kidney stones    "no OR" (10/05/2017)  . HLD (hyperlipidemia)   . Hypertension    Past Surgical History:  Past Surgical History:  Procedure Laterality Date  . APPENDECTOMY    . INGUINAL HERNIA REPAIR Bilateral    HPI:  76 y.o. male admitted on 10/05/17 for sudden vision changes in his left eye. MRI few punctate foci of reduced diffusion in left insula and left temporal lobe compatible with acute/early subacute infarct. Lesions are scattered and peripheral suggesting micro embolic etiology. Pt with significant PMH of HTN and HLD.    Assessment / Plan / Recommendation Clinical Impression  Pt seen for very brief speech-language-cognitive evaluation. Pt did not have complaints on admission or presently re: communication-speech or cognition; only his vision in the upper left quadrant if he covers his eye. Min verbal prompt needed during visuospatial/executive subtest. Pt able to read menu without difficulty visualizing from left to right. Speech and cognition are intact. No further ST needed     SLP Assessment  SLP Recommendation/Assessment: Patient does not need any further Speech Lanaguage Pathology Services    Follow Up Recommendations  None    Frequency and Duration           SLP Evaluation Cognition  Overall Cognitive Status: Within Functional Limits for tasks assessed Arousal/Alertness: Awake/alert Orientation Level: Oriented X4 Attention: Sustained Sustained  Attention: Appears intact Memory: Appears intact Awareness: Appears intact Problem Solving: Appears intact Safety/Judgment: Appears intact       Comprehension  Auditory Comprehension Overall Auditory Comprehension: Appears within functional limits for tasks assessed Commands: Within Functional Limits Conversation: Simple Visual Recognition/Discrimination Discrimination: Not tested Reading Comprehension Reading Status:  (min prompt x 1)    Expression Expression Primary Mode of Expression: Verbal Verbal Expression Overall Verbal Expression: Appears within functional limits for tasks assessed Initiation: No impairment Level of Generative/Spontaneous Verbalization: Conversation Repetition: No impairment Naming: No impairment Pragmatics: No impairment Written Expression Dominant Hand: Right Written Expression: Not tested   Oral / Motor  Oral Motor/Sensory Function Overall Oral Motor/Sensory Function:  (has mild right labial/facial weakness) Motor Speech Overall Motor Speech: Appears within functional limits for tasks assessed Respiration: Within functional limits Phonation: Normal Resonance: Within functional limits Articulation: Within functional limitis Intelligibility: Intelligible Motor Planning: Witnin functional limits   GO                    Houston Siren 10/06/2017, 3:02 PM  Orbie Pyo Lachrista Heslin M.Ed [employer] [phone]

## 2017-10-06 NOTE — Progress Notes (Signed)
STROKE TEAM PROGRESS NOTE   SUBJECTIVE (INTERVAL HISTORY) His daughter and wife are at the bedside.  Patient is found sitting in bedside chair Overall he feels his condition is gradually improving.  Voices no new complaints. No new events reported overnight.  Patient clearly describes yesterdays events. He states his vision has slightly improved, "it is not as dark today".   When asked if he ever had an episode of AFIB,  He describes an episode that occurred 2 months ago when the Left side of his face and Left hand went numb, all symptoms quickly resolved, no PCP follow up. He describes an event 2 years ago when his PCP put him on a Holter monitor for 3 weeks for complaints of dizziness for a few days. He believes he was sent to [facility] at that time.  OBJECTIVE No results for input(s): GLUCAP in the last 168 hours.  Recent Labs Lab 10/05/17 1148  NA 136  K 4.2  CL 104  CO2 26  GLUCOSE 103*  BUN 12  CREATININE 1.23  CALCIUM 9.0    Recent Labs Lab 10/05/17 1148  AST 36  ALT 32  ALKPHOS 77  BILITOT 0.7  PROT 7.0  ALBUMIN 4.0    Recent Labs Lab 10/05/17 1148  WBC 5.6  NEUTROABS 2.9  HGB 14.4  HCT 43.1  MCV 96.2  PLT 193   No results for input(s): CKTOTAL, CKMB, CKMBINDEX, TROPONINI in the last 168 hours. No results for input(s): LABPROT, INR in the last 72 hours. No results for input(s): COLORURINE, LABSPEC, Wagner, GLUCOSEU, HGBUR, BILIRUBINUR, KETONESUR, PROTEINUR, UROBILINOGEN, NITRITE, LEUKOCYTESUR in the last 72 hours.  Invalid input(s): APPERANCEUR     Component Value Date/Time   CHOL 87 10/06/2017 0342   TRIG 171 (H) 10/06/2017 0342   HDL 25 (L) 10/06/2017 0342   CHOLHDL 3.5 10/06/2017 0342   VLDL 34 10/06/2017 0342   LDLCALC 28 10/06/2017 0342   Lab Results  Component Value Date   HGBA1C 5.9 (H) 10/06/2017   No results found for: LABOPIA, COCAINSCRNUR, LABBENZ, AMPHETMU, THCU, LABBARB  No results for input(s): ETH in the last 168  hours.  IMAGING: I have personally reviewed the radiological images below and agree with the radiology interpretations.  Ct Angio Head W Or Wo Contrast  Result Date: 10/05/2017 CLINICAL DATA:  75 y/o  M; retinal vascular occlusion. EXAM: CT ANGIOGRAPHY HEAD AND NECK TECHNIQUE: Multidetector CT imaging of the head and neck was performed using the standard protocol during bolus administration of intravenous contrast. Multiplanar CT image reconstructions and MIPs were obtained to evaluate the vascular anatomy. Carotid stenosis measurements (when applicable) are obtained utilizing NASCET criteria, using the distal internal carotid diameter as the denominator. CONTRAST:  100 cc Isovue 370 COMPARISON:  None. FINDINGS: CT HEAD FINDINGS Brain: No evidence of acute infarction, hemorrhage, hydrocephalus, extra-axial collection or mass lesion/mass effect. Few nonspecific foci of hypoattenuation are present in subcortical and periventricular white matter compatible with mild chronic microvascular ischemic changes. Vascular: As below. Skull: Normal. Negative for fracture or focal lesion. Sinuses: Mastoid tip sclerosis probably representing sequelae of chronic otomastoiditis. Otherwise negative. Orbits: No acute finding. Review of the MIP images confirms the above findings CTA NECK FINDINGS Aortic arch: Standard branching. Imaged portion shows no evidence of aneurysm or dissection. No significant stenosis of the major arch vessel origins. Mild calcific atherosclerosis. Right carotid system: No evidence of dissection, stenosis (50% or greater) or occlusion. Mild calcific atherosclerosis of carotid bifurcation. Left carotid system: No  evidence of dissection, stenosis (50% or greater) or occlusion. Mild calcific atherosclerosis of carotid bifurcation. Vertebral arteries: Right dominant. Dense calcification of right vertebral artery origin with a least moderate underlying stenosis, accurate assessment of luminal stenosis is  suboptimal given dense calcification. Skeleton: Moderate multilevel cervical spondylosis with disc and facet degenerative changes. No high-grade bony canal stenosis. Other neck: Negative. Upper chest: Negative. Review of the MIP images confirms the above findings CTA HEAD FINDINGS Anterior circulation: No significant stenosis, proximal occlusion, aneurysm, or vascular malformation. Calcified plaque of right-greater-than-left carotid siphons. Posterior circulation: No significant stenosis, proximal occlusion, aneurysm, or vascular malformation. Venous sinuses: As permitted by contrast timing, patent. Anatomic variants: Patent anterior communicating artery and right larger than left posterior communicating arteries. Delayed phase: No abnormal intracranial enhancement. Review of the MIP images confirms the above findings IMPRESSION: CT head: 1. No acute intracranial abnormality.  No abnormal enhancement. 2. Mild chronic microvascular ischemic changes and parenchymal volume loss of the brain. 3. No orbital mass or inflammatory process identified. CTA neck: 1. Patent carotid and vertebral arteries. No occlusion, dissection, or aneurysm. 2. Dense calcification of right vertebral artery origin with a least moderate underlying stenosis, accurate assessment of luminal stenosis is suboptimal given dense calcification. 3. Otherwise no significant stenosis by NASCET criteria. 4. Mild calcific atherosclerosis of aortic arch and bilateral carotid bifurcations. CTA head: 1. Patent circle of Willis. No large vessel occlusion, aneurysm, or significant stenosis is identified. 2. Mild calcific atherosclerosis of carotid siphons. Electronically Signed   By: Kristine Garbe M.D.   On: 10/05/2017 13:49   Ct Angio Neck W And/or Wo Contrast  Result Date: 10/05/2017 CLINICAL DATA:  76 y/o  M; retinal vascular occlusion. EXAM: CT ANGIOGRAPHY HEAD AND NECK TECHNIQUE: Multidetector CT imaging of the head and neck was performed  using the standard protocol during bolus administration of intravenous contrast. Multiplanar CT image reconstructions and MIPs were obtained to evaluate the vascular anatomy. Carotid stenosis measurements (when applicable) are obtained utilizing NASCET criteria, using the distal internal carotid diameter as the denominator. CONTRAST:  100 cc Isovue 370 COMPARISON:  None. FINDINGS: CT HEAD FINDINGS Brain: No evidence of acute infarction, hemorrhage, hydrocephalus, extra-axial collection or mass lesion/mass effect. Few nonspecific foci of hypoattenuation are present in subcortical and periventricular white matter compatible with mild chronic microvascular ischemic changes. Vascular: As below. Skull: Normal. Negative for fracture or focal lesion. Sinuses: Mastoid tip sclerosis probably representing sequelae of chronic otomastoiditis. Otherwise negative. Orbits: No acute finding. Review of the MIP images confirms the above findings CTA NECK FINDINGS Aortic arch: Standard branching. Imaged portion shows no evidence of aneurysm or dissection. No significant stenosis of the major arch vessel origins. Mild calcific atherosclerosis. Right carotid system: No evidence of dissection, stenosis (50% or greater) or occlusion. Mild calcific atherosclerosis of carotid bifurcation. Left carotid system: No evidence of dissection, stenosis (50% or greater) or occlusion. Mild calcific atherosclerosis of carotid bifurcation. Vertebral arteries: Right dominant. Dense calcification of right vertebral artery origin with a least moderate underlying stenosis, accurate assessment of luminal stenosis is suboptimal given dense calcification. Skeleton: Moderate multilevel cervical spondylosis with disc and facet degenerative changes. No high-grade bony canal stenosis. Other neck: Negative. Upper chest: Negative. Review of the MIP images confirms the above findings CTA HEAD FINDINGS Anterior circulation: No significant stenosis, proximal  occlusion, aneurysm, or vascular malformation. Calcified plaque of right-greater-than-left carotid siphons. Posterior circulation: No significant stenosis, proximal occlusion, aneurysm, or vascular malformation. Venous sinuses: As permitted by contrast timing, patent. Anatomic  variants: Patent anterior communicating artery and right larger than left posterior communicating arteries. Delayed phase: No abnormal intracranial enhancement. Review of the MIP images confirms the above findings IMPRESSION: CT head: 1. No acute intracranial abnormality.  No abnormal enhancement. 2. Mild chronic microvascular ischemic changes and parenchymal volume loss of the brain. 3. No orbital mass or inflammatory process identified. CTA neck: 1. Patent carotid and vertebral arteries. No occlusion, dissection, or aneurysm. 2. Dense calcification of right vertebral artery origin with a least moderate underlying stenosis, accurate assessment of luminal stenosis is suboptimal given dense calcification. 3. Otherwise no significant stenosis by NASCET criteria. 4. Mild calcific atherosclerosis of aortic arch and bilateral carotid bifurcations. CTA head: 1. Patent circle of Willis. No large vessel occlusion, aneurysm, or significant stenosis is identified. 2. Mild calcific atherosclerosis of carotid siphons. Electronically Signed   By: Kristine Garbe M.D.   On: 10/05/2017 13:49   Mr Brain Wo Contrast  Result Date: 10/05/2017 CLINICAL DATA:  76 y/o  M; evaluation for stroke. EXAM: MRI HEAD WITHOUT CONTRAST TECHNIQUE: Multiplanar, multiecho pulse sequences of the brain and surrounding structures were obtained without intravenous contrast. COMPARISON:  10/05/2017 CT angiogram of head and neck. FINDINGS: Brain: Few punctate foci of reduced diffusion within left insula and temporal lobe compatible with areas of acute/ early subacute infarction. The lesions are present at the gray-white junction. Scattered nonspecific foci of T2 FLAIR  hyperintense signal abnormality in subcortical and periventricular white matter are compatible with mild chronic microvascular ischemic changes for age. Mild brain parenchymal volume loss. No abnormal susceptibility hypointensity to indicate intracranial hemorrhage. No focal mass effect, hydrocephalus, extra-axial collection, or herniation. Vascular: Normal flow voids. Skull and upper cervical spine: Normal marrow signal. Sinuses/Orbits: Negative. Other: None. IMPRESSION: 1. Few punctate foci of reduced diffusion in left insula and left temporal lobe compatible with acute/early subacute infarct. Lesions are scattered and peripheral suggesting micro embolic etiology. 2. No acute hemorrhage or mass effect. 3. Mild for age chronic microvascular ischemic changes and mild parenchymal volume loss of the brain. Electronically Signed   By: Kristine Garbe M.D.   On: 10/05/2017 21:23    PHYSICAL EXAM Temp:  [97.2 F (36.2 C)-98.4 F (36.9 C)] 98.2 F (36.8 C) (11/01 1300) Pulse Rate:  [51-68] 68 (11/01 1300) Resp:  [14-18] 17 (11/01 1003) BP: (116-163)/(56-87) 144/75 (11/01 1300) SpO2:  [96 %-100 %] 97 % (11/01 1300) Weight:  [82.9 kg (182 lb 12.2 oz)] 82.9 kg (182 lb 12.2 oz) (11/01 1003)  General - Well nourished, well developed, in no apparent distress Respiratory - Lungs clear bilaterally. No wheezing. Cardiovascular - Regular rate and rhythm with no murmur  Neurological Examination Mental Status: Alert, oriented, thought content appropriate.  Speech fluent without evidence of aphasia.  Able to follow 3 step commands without difficulty. Cranial Nerves: II: Left eye: quadrantanopsia in the superior medial quadrant only III,IV, VI: ptosis not present, extra-ocular motions intact bilaterally, pupils equal, round, reactive to light and accommodation V,VII: smile symmetric, facial light touch sensation normal bilaterally VIII: hearing normal bilaterally IX,X: uvula rises symmetrically XI:  bilateral shoulder shrug XII: midline tongue extension Motor: Right :  Upper extremity   5/5                                      Left:     Upper extremity   5/5  Lower extremity   5/5                                                  Lower extremity   5/5 Tone and bulk:normal tone throughout; no atrophy noted Sensory:  light touch intact throughout, bilaterally Cerebellar: normal finger-to-nose, normal rapid alternating movements and normal heel-to-shin test Gait: Not tested  ASSESSMENT AND PLAN: Mr. Zachary Calhoun is a 76 y.o. male with PMH of HTN, HLD, admitted for sudden onset of left medial inferior quadrantanopsia and abnormalities visualized in the dilated exam from his ophthalmologist.  Likely Left branch retinal artery occlusion Acute, small Left Temporal Stroke: secondary to embolic source, likely undiagnosed AFIB Likely undiagnosed TIA 2 months ago   Resultant:     positive medial inferior quadrantanopsia of the Left eye   2D Echo: Study completed and Results pending   Scheduled for TEE tomorrow at noon with Dr. Meda Coffee     VTE prophylaxis: SCD's   Diet Heart Room service appropriate? Yes; Fluid consistency: Thin  Diet NPO time specified  Diet NPO time specified Except for: Sips with Meds    aspirin 81 mg daily prior to admission, now on clopidogrel 75 mg daily.        Please discharge patient on clopidogrel 75 mg daily  Patient counseled to be compliant with his antithrombotic medications        Therapy recommendations:  Likely HOME with Outpatient OT       Disposition: HOME          Recommendations:   Scheduled for TEE tomorrow at noon with Dr. Meda Coffee and possible Loop Recorder implantation if TEE negative.  May be discharged home once TEE is negative and Loop recorder has been implanted.   Will need to get records from [facility] regarding Holter monitoring 2 years ago.  Ongoing aggressive stroke risk factor management         Follow up Appointments:    PCP follow up   Follow up with [facility], Dr Leonie Man in 6 weeks  Diabetes:  HgbA1c  5.9   goal < 7.0  Controlled  Hypertension: Stable Permissive hypertension (OK if <220/120) for 24-48 hours post stroke and then gradually normalized within 5-7 days.  Long term BP goal normotensive. May slowly restart home B/P medications after 48 hours with close PCP follow up.  Hyperlipidemia:  Home meds:  Lipitor 40 mg  LDL      28   , goal < 70  Now on Lipitor to 40 mg daily  Continue statin at discharge  Other Stroke Risk Factors:  Advanced age  Hospital day # 0  Renie Ora Stroke Neurology Team 10/06/2017 4:07 PM I have personally examined this patient, reviewed notes, independently viewed imaging studies, participated in medical decision making and plan of care.ROS completed by me personally and pertinent positives fully documented  I have made any additions or clarifications directly to the above note. Agree with note above. ,  He presented with retinal artery branch occlusion as well as silent left MCA infarct likely of embolic etiology and atrial fibrillation seems most likely.  Recommend check transesophageal echocardiogram and loop recorder tomorrow.  Continue Plavix for stroke prevention.  Long discussion with patient and family and answered questions.  Discussed with Dr. Eliseo Squires.The duration of this visit  was 35 minutes of face-to-face time with the patient. Greater than 50% of this time was spent in counseling, explanation of diagnosis of embolic stroke, paroxysmal atrial fibrillation, planning of further management, and coordination of care.      Antony Contras, MD Medical Director [facility] Pager: [phone] 10/06/2017 6:54 PM  Neurology to sign-off at this time. Please call with any further questions or concerns. Thank you for this consultation.  To contact Stroke Continuity provider, please refer to  [email or web address] After hours, contact General Neurology

## 2017-10-06 NOTE — Progress Notes (Signed)
PROGRESS NOTE    Zachary Calhoun  IOX:[id number] DOB: 1941/06/27 DOA: 10/05/2017 PCP: [facility]   Outpatient Specialists:     Brief Narrative:  Zachary Calhoun is a 76 y.o. male who presents with acute vision loss concerning for ischemic event (??Retinal artery occlusion). NEuro following and stroke workup underway.    Assessment & Plan:   Principal Problem:   Acute loss of vision, left Active Problems:   HLD (hyperlipidemia)   Hypertension   Ischemic stroke (HCC)   Acute loss of vision, left -Patient presents to ER after > 12 hours vision loss-initially seen by ophthalmologist who detected arterial occlusions on dilated exam and sent patient immediately to ER concerning for focal embolic retinal artery stroke -MRI brain shows new CVas -Echocardiogram: pending -change to plavix -FLP/HgbA1c: LDL 28; 5.9 HgbA1c -PT/OT/SLP evaluation -Frequent neurological checks -will need TEE/loop    Hypertension -In the context of possible acute stroke will allow for permissive hypertension    HLD (hyperlipidemia) -Continue preadmission Lipitor and omega-3 fatty acids    Allergic conjunctivitis -Visine/Tetrahydrolozine -Ketorolac ophthalmic 1 drop OU QID    DVT prophylaxis:  Lovenox   Code Status: Full Code   Family Communication:   Disposition Plan:     Consultants:   neuro   Subjective: No SOB, no CP  Objective: Vitals:   10/06/17 0400 10/06/17 0600 10/06/17 0700 10/06/17 1003  BP: 123/62 137/66 (!) 163/79 (!) 150/70  Pulse: (!) 51 61 (!) 56 60  Resp: 16 18  17   Temp: 98.1 F (36.7 C) 97.7 F (36.5 C) 97.7 F (36.5 C) 97.8 F (36.6 C)  TempSrc: Oral Oral Oral Oral  SpO2: 98% 97% 100% 99%  Weight:    82.9 kg (182 lb 12.2 oz)  Height:    5\' 8"  (1.727 m)    Intake/Output Summary (Last 24 hours) at 10/06/17 1342 Last data filed at 10/06/17 0930  Gross per 24 hour  Intake              240 ml  Output                0 ml  Net               240 ml   Filed Weights   10/06/17 1003  Weight: 82.9 kg (182 lb 12.2 oz)    Examination:  General exam: Appears calm and comfortable  Respiratory system: Clear to auscultation. Respiratory effort normal. Cardiovascular system: S1 & S2 heard, RRR. No JVD, murmurs, rubs, gallops or clicks. No pedal edema. Gastrointestinal system: Abdomen is nondistended, soft and nontender. No organomegaly or masses felt. Normal bowel sounds heard. Central nervous system: Alert and oriented. No focal neurological deficits. Extremities: Symmetric 5 x 5 power. Skin: No rashes, lesions or ulcers Psychiatry: Judgement and insight appear normal. Mood & affect appropriate.     Data Reviewed: I have personally reviewed following labs and imaging studies  CBC:  Recent Labs Lab 10/05/17 1148  WBC 5.6  NEUTROABS 2.9  HGB 14.4  HCT 43.1  MCV 96.2  PLT 268   Basic Metabolic Panel:  Recent Labs Lab 10/05/17 1148  NA 136  K 4.2  CL 104  CO2 26  GLUCOSE 103*  BUN 12  CREATININE 1.23  CALCIUM 9.0   GFR: Estimated Creatinine Clearance: 53.6 mL/min (by C-G formula based on SCr of 1.23 mg/dL). Liver Function Tests:  Recent Labs Lab 10/05/17 1148  AST 36  ALT 32  ALKPHOS  77  BILITOT 0.7  PROT 7.0  ALBUMIN 4.0   No results for input(s): LIPASE, AMYLASE in the last 168 hours. No results for input(s): AMMONIA in the last 168 hours. Coagulation Profile: No results for input(s): INR, PROTIME in the last 168 hours. Cardiac Enzymes: No results for input(s): CKTOTAL, CKMB, CKMBINDEX, TROPONINI in the last 168 hours. BNP (last 3 results) No results for input(s): PROBNP in the last 8760 hours. HbA1C:  Recent Labs  10/06/17 0342  HGBA1C 5.9*   CBG: No results for input(s): GLUCAP in the last 168 hours. Lipid Profile:  Recent Labs  10/06/17 0342  CHOL 87  HDL 25*  LDLCALC 28  TRIG 171*  CHOLHDL 3.5   Thyroid Function Tests: No results for input(s): TSH, T4TOTAL,  FREET4, T3FREE, THYROIDAB in the last 72 hours. Anemia Panel: No results for input(s): VITAMINB12, FOLATE, FERRITIN, TIBC, IRON, RETICCTPCT in the last 72 hours. Urine analysis: No results found for: COLORURINE, APPEARANCEUR, LABSPEC, [place], GLUCOSEU, HGBUR, BILIRUBINUR, KETONESUR, PROTEINUR, UROBILINOGEN, NITRITE, LEUKOCYTESUR  )No results found for this or any previous visit (from the past 240 hour(s)).    Anti-infectives    None       Radiology Studies: Ct Angio Head W Or Calhoun Contrast  Result Date: 10/05/2017 CLINICAL DATA:  76 y/o  M; retinal vascular occlusion. EXAM: CT ANGIOGRAPHY HEAD AND NECK TECHNIQUE: Multidetector CT imaging of the head and neck was performed using the standard protocol during bolus administration of intravenous contrast. Multiplanar CT image reconstructions and MIPs were obtained to evaluate the vascular anatomy. Carotid stenosis measurements (when applicable) are obtained utilizing NASCET criteria, using the distal internal carotid diameter as the denominator. CONTRAST:  100 cc Isovue 370 COMPARISON:  None. FINDINGS: CT HEAD FINDINGS Brain: No evidence of acute infarction, hemorrhage, hydrocephalus, extra-axial collection or mass lesion/mass effect. Few nonspecific foci of hypoattenuation are present in subcortical and periventricular white matter compatible with mild chronic microvascular ischemic changes. Vascular: As below. Skull: Normal. Negative for fracture or focal lesion. Sinuses: Mastoid tip sclerosis probably representing sequelae of chronic otomastoiditis. Otherwise negative. Orbits: No acute finding. Review of the MIP images confirms the above findings CTA NECK FINDINGS Aortic arch: Standard branching. Imaged portion shows no evidence of aneurysm or dissection. No significant stenosis of the major arch vessel origins. Mild calcific atherosclerosis. Right carotid system: No evidence of dissection, stenosis (50% or greater) or occlusion. Mild calcific  atherosclerosis of carotid bifurcation. Left carotid system: No evidence of dissection, stenosis (50% or greater) or occlusion. Mild calcific atherosclerosis of carotid bifurcation. Vertebral arteries: Right dominant. Dense calcification of right vertebral artery origin with a least moderate underlying stenosis, accurate assessment of luminal stenosis is suboptimal given dense calcification. Skeleton: Moderate multilevel cervical spondylosis with disc and facet degenerative changes. No high-grade bony canal stenosis. Other neck: Negative. Upper chest: Negative. Review of the MIP images confirms the above findings CTA HEAD FINDINGS Anterior circulation: No significant stenosis, proximal occlusion, aneurysm, or vascular malformation. Calcified plaque of right-greater-than-left carotid siphons. Posterior circulation: No significant stenosis, proximal occlusion, aneurysm, or vascular malformation. Venous sinuses: As permitted by contrast timing, patent. Anatomic variants: Patent anterior communicating artery and right larger than left posterior communicating arteries. Delayed phase: No abnormal intracranial enhancement. Review of the MIP images confirms the above findings IMPRESSION: CT head: 1. No acute intracranial abnormality.  No abnormal enhancement. 2. Mild chronic microvascular ischemic changes and parenchymal volume loss of the brain. 3. No orbital mass or inflammatory process identified. CTA neck: 1. Patent  carotid and vertebral arteries. No occlusion, dissection, or aneurysm. 2. Dense calcification of right vertebral artery origin with a least moderate underlying stenosis, accurate assessment of luminal stenosis is suboptimal given dense calcification. 3. Otherwise no significant stenosis by NASCET criteria. 4. Mild calcific atherosclerosis of aortic arch and bilateral carotid bifurcations. CTA head: 1. Patent circle of Willis. No large vessel occlusion, aneurysm, or significant stenosis is identified. 2. Mild  calcific atherosclerosis of carotid siphons. Electronically Signed   By: Kristine Garbe M.D.   On: 10/05/2017 13:49   Ct Angio Neck W And/or Calhoun Contrast  Result Date: 10/05/2017 CLINICAL DATA:  76 y/o  M; retinal vascular occlusion. EXAM: CT ANGIOGRAPHY HEAD AND NECK TECHNIQUE: Multidetector CT imaging of the head and neck was performed using the standard protocol during bolus administration of intravenous contrast. Multiplanar CT image reconstructions and MIPs were obtained to evaluate the vascular anatomy. Carotid stenosis measurements (when applicable) are obtained utilizing NASCET criteria, using the distal internal carotid diameter as the denominator. CONTRAST:  100 cc Isovue 370 COMPARISON:  None. FINDINGS: CT HEAD FINDINGS Brain: No evidence of acute infarction, hemorrhage, hydrocephalus, extra-axial collection or mass lesion/mass effect. Few nonspecific foci of hypoattenuation are present in subcortical and periventricular white matter compatible with mild chronic microvascular ischemic changes. Vascular: As below. Skull: Normal. Negative for fracture or focal lesion. Sinuses: Mastoid tip sclerosis probably representing sequelae of chronic otomastoiditis. Otherwise negative. Orbits: No acute finding. Review of the MIP images confirms the above findings CTA NECK FINDINGS Aortic arch: Standard branching. Imaged portion shows no evidence of aneurysm or dissection. No significant stenosis of the major arch vessel origins. Mild calcific atherosclerosis. Right carotid system: No evidence of dissection, stenosis (50% or greater) or occlusion. Mild calcific atherosclerosis of carotid bifurcation. Left carotid system: No evidence of dissection, stenosis (50% or greater) or occlusion. Mild calcific atherosclerosis of carotid bifurcation. Vertebral arteries: Right dominant. Dense calcification of right vertebral artery origin with a least moderate underlying stenosis, accurate assessment of luminal  stenosis is suboptimal given dense calcification. Skeleton: Moderate multilevel cervical spondylosis with disc and facet degenerative changes. No high-grade bony canal stenosis. Other neck: Negative. Upper chest: Negative. Review of the MIP images confirms the above findings CTA HEAD FINDINGS Anterior circulation: No significant stenosis, proximal occlusion, aneurysm, or vascular malformation. Calcified plaque of right-greater-than-left carotid siphons. Posterior circulation: No significant stenosis, proximal occlusion, aneurysm, or vascular malformation. Venous sinuses: As permitted by contrast timing, patent. Anatomic variants: Patent anterior communicating artery and right larger than left posterior communicating arteries. Delayed phase: No abnormal intracranial enhancement. Review of the MIP images confirms the above findings IMPRESSION: CT head: 1. No acute intracranial abnormality.  No abnormal enhancement. 2. Mild chronic microvascular ischemic changes and parenchymal volume loss of the brain. 3. No orbital mass or inflammatory process identified. CTA neck: 1. Patent carotid and vertebral arteries. No occlusion, dissection, or aneurysm. 2. Dense calcification of right vertebral artery origin with a least moderate underlying stenosis, accurate assessment of luminal stenosis is suboptimal given dense calcification. 3. Otherwise no significant stenosis by NASCET criteria. 4. Mild calcific atherosclerosis of aortic arch and bilateral carotid bifurcations. CTA head: 1. Patent circle of Willis. No large vessel occlusion, aneurysm, or significant stenosis is identified. 2. Mild calcific atherosclerosis of carotid siphons. Electronically Signed   By: Kristine Garbe M.D.   On: 10/05/2017 13:49   Zachary Calhoun Contrast  Result Date: 10/05/2017 CLINICAL DATA:  76 y/o  M; evaluation for stroke. EXAM: MRI HEAD WITHOUT  CONTRAST TECHNIQUE: Multiplanar, multiecho pulse sequences of the brain and surrounding  structures were obtained without intravenous contrast. COMPARISON:  10/05/2017 CT angiogram of head and neck. FINDINGS: Brain: Few punctate foci of reduced diffusion within left insula and temporal lobe compatible with areas of acute/ early subacute infarction. The lesions are present at the gray-white junction. Scattered nonspecific foci of T2 FLAIR hyperintense signal abnormality in subcortical and periventricular white matter are compatible with mild chronic microvascular ischemic changes for age. Mild brain parenchymal volume loss. No abnormal susceptibility hypointensity to indicate intracranial hemorrhage. No focal mass effect, hydrocephalus, extra-axial collection, or herniation. Vascular: Normal flow voids. Skull and upper cervical spine: Normal marrow signal. Sinuses/Orbits: Negative. Other: None. IMPRESSION: 1. Few punctate foci of reduced diffusion in left insula and left temporal lobe compatible with acute/early subacute infarct. Lesions are scattered and peripheral suggesting micro embolic etiology. 2. No acute hemorrhage or mass effect. 3. Mild for age chronic microvascular ischemic changes and mild parenchymal volume loss of the brain. Electronically Signed   By: Kristine Garbe M.D.   On: 10/05/2017 21:23        Scheduled Meds: . aspirin  300 mg Rectal Daily   Or  . aspirin  325 mg Oral Daily  . atorvastatin  40 mg Oral Daily  . enoxaparin (LOVENOX) injection  40 mg Subcutaneous Q24H  . ketorolac  1 drop Both Eyes QID  . omega-3 acid ethyl esters  1 g Oral BID   Continuous Infusions:   LOS: 0 days    Time spent: 35 min    [place], DO [facility] Pager [phone]  If 7PM-7AM, please contact night-coverage [email or web address] Password [email or web address] 10/06/2017, 1:42 PM

## 2017-10-06 NOTE — Progress Notes (Signed)
CTA neck completed 10/31. Please advise if carotid duplex still needed.   Vascular lab [phone]

## 2017-10-06 NOTE — Progress Notes (Signed)
  Echocardiogram 2D Echocardiogram has been performed.  Zachary Calhoun 10/06/2017, 9:56 AM

## 2017-10-06 NOTE — Care Management Note (Signed)
Case Management Note  Patient Details  Name: Zachary Calhoun MRN: [id number] Date of Birth: 1940/12/10  Subjective/Objective:    Pt admitted with CVA. He is from home with his spouse.               Action/Plan: No f/u per PT and no DME. Plan is for TEE tomorrow. CM following for d/c needs, physician orders.   Expected Discharge Date:                  Expected Discharge Plan:  Home/Self Care  In-House Referral:     Discharge planning Services     Post Acute Care Choice:    Choice offered to:     DME Arranged:    DME Agency:     HH Arranged:    HH Agency:     Status of Service:  In process, will continue to follow  If discussed at Long Length of Stay Meetings, dates discussed:    Additional Comments:  Pollie Friar, RN 10/06/2017, 3:41 PM

## 2017-10-06 NOTE — Progress Notes (Signed)
    [facility] has been requested to perform a transesophageal echocardiogram on Zachary Calhoun for Stroke.  After careful review of history and examination, the risks and benefits of transesophageal echocardiogram have been explained including risks of esophageal damage, perforation (1:10,000 risk), bleeding, pharyngeal hematoma as well as other potential complications associated with conscious sedation including aspiration, arrhythmia, respiratory failure and death. Alternatives to treatment were discussed, questions were answered. Patient is willing to proceed.   Pt is scheduled for TEE tomorrow at noon with Dr. Meda Coffee. NPO at [place].  Tami Lin [place], [place]  10/06/2017 3:05 PM

## 2017-10-06 NOTE — Progress Notes (Addendum)
OT Sceen    10/06/17 1500  OT Visit Information  Last OT Received On 10/06/17  Reason Eval/Treat Not Completed OT screened, no needs identified, will sign off (Pt performing ADLs and functional mobility near baseline level. Pt continues to report visual field cut to L eye. Educated pt and family on compensatory techniques and waiting to drive till cleared by MD. Pt and family confirming 24 hour support at [employer].)     [place], OTR/L Acute Rehab Pager: [phone] Office: [phone]

## 2017-10-07 ENCOUNTER — Inpatient Hospital Stay (HOSPITAL_COMMUNITY): Payer: Medicare Other | Admitting: Certified Registered Nurse Anesthetist

## 2017-10-07 ENCOUNTER — Encounter (HOSPITAL_COMMUNITY)
Admission: EM | Disposition: A | Discharge status: Home / Self Care | Payer: Self-pay | Source: Home / Self Care | Attending: Internal Medicine

## 2017-10-07 ENCOUNTER — Inpatient Hospital Stay (HOSPITAL_COMMUNITY): Payer: Medicare Other

## 2017-10-07 ENCOUNTER — Encounter (HOSPITAL_COMMUNITY): Payer: Self-pay | Admitting: *Deleted

## 2017-10-07 DIAGNOSIS — H53132 Sudden visual loss, left eye: Secondary | ICD-10-CM

## 2017-10-07 DIAGNOSIS — I6389 Other cerebral infarction: Secondary | ICD-10-CM

## 2017-10-07 HISTORY — PX: LOOP RECORDER INSERTION: EP1214

## 2017-10-07 HISTORY — PX: TEE WITHOUT CARDIOVERSION: SHX5443

## 2017-10-07 LAB — BASIC METABOLIC PANEL
Anion gap: 7 (ref 5–15)
BUN: 17 mg/dL (ref 6–20)
CHLORIDE: 104 mmol/L (ref 101–111)
CO2: 27 mmol/L (ref 22–32)
CREATININE: 1.37 mg/dL — AB (ref 0.61–1.24)
Calcium: 8.9 mg/dL (ref 8.9–10.3)
GFR calc Af Amer: 56 mL/min — ABNORMAL LOW (ref >60)
GFR, EST NON AFRICAN AMERICAN: 49 mL/min — AB (ref >60)
GLUCOSE: 109 mg/dL — AB (ref 65–99)
POTASSIUM: 4.7 mmol/L (ref 3.5–5.1)
Sodium: 138 mmol/L (ref 135–145)

## 2017-10-07 LAB — PROTIME-INR
INR: 1.05
Prothrombin Time: 13.6 seconds (ref 11.4–15.2)

## 2017-10-07 SURGERY — LOOP RECORDER INSERTION

## 2017-10-07 SURGERY — ECHOCARDIOGRAM, TRANSESOPHAGEAL
Anesthesia: General

## 2017-10-07 MED ORDER — LIDOCAINE-EPINEPHRINE 1 %-1:100000 IJ SOLN
INTRAMUSCULAR | Status: AC
  Filled 2017-10-07: qty 1

## 2017-10-07 MED ORDER — CLOPIDOGREL BISULFATE 75 MG PO TABS: 75.0000 mg | ORAL_TABLET | Freq: Every day | ORAL | 0 refills | Status: AC

## 2017-10-07 MED ORDER — PROPOFOL 500 MG/50ML IV EMUL
INTRAVENOUS | Status: DC | PRN
  Administered 2017-10-07: 75 ug/kg/min via INTRAVENOUS

## 2017-10-07 MED ORDER — LIDOCAINE 2% (20 MG/ML) 5 ML SYRINGE
INTRAMUSCULAR | Status: DC | PRN
  Administered 2017-10-07: 20 mg via INTRAVENOUS

## 2017-10-07 MED ORDER — BUTAMBEN-TETRACAINE-BENZOCAINE 2-2-14 % EX AERO
INHALATION_SPRAY | CUTANEOUS | Status: DC | PRN
  Administered 2017-10-07: 2 via TOPICAL

## 2017-10-07 MED ORDER — PROPOFOL 10 MG/ML IV BOLUS
INTRAVENOUS | Status: DC | PRN
  Administered 2017-10-07: 20 mg via INTRAVENOUS

## 2017-10-07 MED ORDER — SODIUM CHLORIDE 0.9 % IV SOLN
INTRAVENOUS | Status: DC
  Administered 2017-10-07: 12:00:00 via INTRAVENOUS

## 2017-10-07 MED ORDER — GLYCOPYRROLATE 0.2 MG/ML IV SOSY
PREFILLED_SYRINGE | INTRAVENOUS | Status: DC | PRN
Start: 2017-10-07 — End: 2017-10-07
  Administered 2017-10-07: .1 mg via INTRAVENOUS

## 2017-10-07 SURGICAL SUPPLY — 2 items
LOOP REVEAL LINQSYS (Prosthesis & Implant Heart) ×2 IMPLANT
PACK LOOP INSERTION (CUSTOM PROCEDURE TRAY) ×3 IMPLANT

## 2017-10-07 NOTE — Progress Notes (Signed)
  Echocardiogram Echocardiogram Transesophageal has been performed.  Darlina Sicilian M 10/07/2017, 12:30 PM

## 2017-10-07 NOTE — Anesthesia Preprocedure Evaluation (Signed)
Anesthesia Evaluation  Patient identified by MRN, date of birth, ID band Patient awake    Reviewed: Allergy & Precautions, NPO status , Patient's Chart, lab work & pertinent test results  Airway Mallampati: II       Dental no notable dental hx. (+) Teeth Intact   Pulmonary neg pulmonary ROS,    Pulmonary exam normal        Cardiovascular hypertension, Pt. on medications Normal cardiovascular exam Rhythm:Regular Rate:Normal     Neuro/Psych negative neurological ROS  negative psych ROS   GI/Hepatic negative GI ROS, Neg liver ROS,   Endo/Other  negative endocrine ROS  Renal/GU negative Renal ROS     Musculoskeletal negative musculoskeletal ROS (+)   Abdominal Normal abdominal exam  (+)   Peds  Hematology negative hematology ROS (+)   Anesthesia Other Findings   Reproductive/Obstetrics                             Anesthesia Physical Anesthesia Plan  ASA: II  Anesthesia Plan: General   Post-op Pain Management:    Induction: Intravenous  PONV Risk Score and Plan: 2 and Ondansetron, Dexamethasone and Treatment may vary due to age or medical condition  Airway Management Planned: Natural Airway and Mask  Additional Equipment:   Intra-op Plan:   Post-operative Plan:   Informed Consent: I have reviewed the patients History and Physical, chart, labs and discussed the procedure including the risks, benefits and alternatives for the proposed anesthesia with the patient or authorized representative who has indicated his/her understanding and acceptance.   Dental advisory given  Plan Discussed with: CRNA and Surgeon  Anesthesia Plan Comments:         Anesthesia Quick Evaluation

## 2017-10-07 NOTE — Plan of Care (Signed)
Problem: Pain Managment: Goal: General experience of comfort will improve Outcome: Progressing No c/o pain at this time.   

## 2017-10-07 NOTE — Anesthesia Postprocedure Evaluation (Signed)
Anesthesia Post Note  Patient: Zachary Calhoun  Procedure(s) Performed: TRANSESOPHAGEAL ECHOCARDIOGRAM (TEE) (N/A )     Patient location during evaluation: Endoscopy Anesthesia Type: MAC Level of consciousness: awake Pain management: pain level controlled Vital Signs Assessment: post-procedure vital signs reviewed and stable Respiratory status: spontaneous breathing Cardiovascular status: stable Postop Assessment: no apparent nausea or vomiting Anesthetic complications: no    Last Vitals:  Vitals:   10/07/17 1230 10/07/17 1235  BP: (!) 144/86   Pulse: 72   Resp: 17   Temp:    SpO2: 95% 94%    Last Pain:  Vitals:   10/07/17 1220  TempSrc: Oral   Pain Goal:                 Luberta Grabinski JR,JOHN Myasia Sinatra

## 2017-10-07 NOTE — Progress Notes (Signed)
Pt given discharge paperwork and plavix prescription. All questions answered.

## 2017-10-07 NOTE — Discharge Summary (Signed)
Physician Discharge Summary  Zachary Calhoun  KVQ:[id number]  DOB: Jan 23, 1941  DOA: 10/05/2017 PCP: [facility] [place] date: 10/05/2017 Discharge date: 10/07/2017  Admitted From: Home  Disposition:  Home   Recommendations for Outpatient Follow-up:  1. Follow up with PCP in 1-2 weeks 2. Follow up with Neurology in 6 weeks  3. Follow up with Cardiology in 2-3 weeks   Discharge Condition: Stable  CODE STATUS: Full Code  Diet recommendation: Heart Healthy   Brief/Interim Summary: For full details see H&P/progress note but in brief, Zachary Calhoun is a 76 year old male who presented with acute vision loss concerning of ischemic event.  Patient was admitted with working diagnosis of stroke.  Neurology was consulted, MRI showed left MCA infarct, patient was started on Plavix patient underwent stroke workup with negative TTE, TEE was suggested which was negative for PFO and negative bubble study.  Loop recorder was inserted.  Patient was evaluated by physical and occupational therapist did not recommend any further interventions.  Patient did not have any other symptoms and was discharged home to follow-up with neurology as an outpatient.  Subjective: Patient seen and examined with family at bedside, he has no complaints today.  Visual changes remain the same.  Denies weakness, dizziness and numbness.  Tolerating diet well and participating with physical therapy with no issues  Discharge Diagnoses/Hospital Course:  Acute CVA, left branch retinal artery occlusion  Felt to be due to embolic source, questionable undiagnosed A. fib Telemetry monitor with no events, echocardiogram normal TEE with no PFO and bubble study negative Loop recorder inserted Patient started on Plavix at 75 mg daily Lipitor 40 mg daily A1c 5.9 LDL 28  Hypertension Captopril was on hold in view of acute stroke, allowing permissive hypertension Can resume blood pressure medication the next 24  hours. Follow-up with primary care provider.  All other chronic medical condition were stable during the hospitalization.  Patient was seen by physical therapy, no further recommendations On the day of the discharge the patient's vitals were stable, and no other acute medical condition were reported by patient. the patient was felt safe to be discharge to home.  Discharge Instructions  You were cared for by a hospitalist during your hospital stay. If you have any questions about your discharge medications or the care you received while you were in the hospital after you are discharged, you can call the unit and asked to speak with the hospitalist on call if the hospitalist that took care of you is not available. Once you are discharged, your primary care physician will handle any further medical issues. Please note that NO REFILLS for any discharge medications will be authorized once you are discharged, as it is imperative that you return to your primary care physician (or establish a relationship with a primary care physician if you do not have one) for your aftercare needs so that they can reassess your need for medications and monitor your lab values.  Discharge Instructions    Ambulatory referral to Neurology    Complete by:  As directed    An appointment is requested in approximately: 6 weeks Follow up with stroke clinic (Dr Leonie Man preferred, if not available, then consider Cecille Rubin, Dr Erlinda Hong, [facility] or Jaynee Eagles whoever is available) at [facility] in about 6-8 weeks. Thanks.   Call MD for:  difficulty breathing, headache or visual disturbances    Complete by:  As directed    Call MD for:  extreme fatigue  Complete by:  As directed    Call MD for:  hives    Complete by:  As directed    Call MD for:  persistant dizziness or light-headedness    Complete by:  As directed    Call MD for:  persistant nausea and vomiting    Complete by:  As directed    Call MD for:  redness, tenderness, or signs  of infection (pain, swelling, redness, odor or green/yellow discharge around incision site)    Complete by:  As directed    Call MD for:  severe uncontrolled pain    Complete by:  As directed    Call MD for:  temperature >100.4    Complete by:  As directed    Diet - low sodium heart healthy    Complete by:  As directed    Discharge instructions    Complete by:  As directed    Resume Captopril in 48 hrs   Increase activity slowly    Complete by:  As directed      Allergies as of 10/07/2017   No Known Allergies     Medication List    STOP taking these medications   aspirin EC 81 MG tablet   captopril 50 MG tablet Commonly known as:  CAPOTEN     TAKE these medications   atorvastatin 40 MG tablet Commonly known as:  LIPITOR Take 40 mg by mouth daily.   clopidogrel 75 MG tablet Commonly known as:  PLAVIX Take 1 tablet (75 mg total) by mouth daily.   Fish Oil 1000 MG Caps Take 1,000 mg by mouth daily.      Follow-up Information    Garvin Fila, MD. Schedule an appointment as soon as possible for a visit in 6 week(s).   Specialties:  Neurology, Radiology Contact information: [address] [place] [address] [place], [facility]. Schedule an appointment as soon as possible for a visit in 1 week(s).   Specialty:  Family Medicine Why:  Hospital follow up  Contact information: [place] [address] [phone]        Constance Haw, MD Follow up in 1 week(s).   Specialty:  Cardiology Why:  Please call the office to schedule a follow up appointment if you do not receive a call 1 week from discharge.  Contact information: [address] [place] [address] [phone]          No Known Allergies  Consultations:  Neurology   Cardiology    Procedures/Studies: Ct Angio Head W Or Wo Contrast  Result Date: 10/05/2017 CLINICAL DATA:  76 y/o  M; retinal vascular  occlusion. EXAM: CT ANGIOGRAPHY HEAD AND NECK TECHNIQUE: Multidetector CT imaging of the head and neck was performed using the standard protocol during bolus administration of intravenous contrast. Multiplanar CT image reconstructions and MIPs were obtained to evaluate the vascular anatomy. Carotid stenosis measurements (when applicable) are obtained utilizing NASCET criteria, using the distal internal carotid diameter as the denominator. CONTRAST:  100 cc Isovue 370 COMPARISON:  None. FINDINGS: CT HEAD FINDINGS Brain: No evidence of acute infarction, hemorrhage, hydrocephalus, extra-axial collection or mass lesion/mass effect. Few nonspecific foci of hypoattenuation are present in subcortical and periventricular white matter compatible with mild chronic microvascular ischemic changes. Vascular: As below. Skull: Normal. Negative for fracture or focal lesion. Sinuses: Mastoid tip sclerosis probably representing sequelae of chronic otomastoiditis. Otherwise negative.  Orbits: No acute finding. Review of the MIP images confirms the above findings CTA NECK FINDINGS Aortic arch: Standard branching. Imaged portion shows no evidence of aneurysm or dissection. No significant stenosis of the major arch vessel origins. Mild calcific atherosclerosis. Right carotid system: No evidence of dissection, stenosis (50% or greater) or occlusion. Mild calcific atherosclerosis of carotid bifurcation. Left carotid system: No evidence of dissection, stenosis (50% or greater) or occlusion. Mild calcific atherosclerosis of carotid bifurcation. Vertebral arteries: Right dominant. Dense calcification of right vertebral artery origin with a least moderate underlying stenosis, accurate assessment of luminal stenosis is suboptimal given dense calcification. Skeleton: Moderate multilevel cervical spondylosis with disc and facet degenerative changes. No high-grade bony canal stenosis. Other neck: Negative. Upper chest: Negative. Review of the MIP  images confirms the above findings CTA HEAD FINDINGS Anterior circulation: No significant stenosis, proximal occlusion, aneurysm, or vascular malformation. Calcified plaque of right-greater-than-left carotid siphons. Posterior circulation: No significant stenosis, proximal occlusion, aneurysm, or vascular malformation. Venous sinuses: As permitted by contrast timing, patent. Anatomic variants: Patent anterior communicating artery and right larger than left posterior communicating arteries. Delayed phase: No abnormal intracranial enhancement. Review of the MIP images confirms the above findings IMPRESSION: CT head: 1. No acute intracranial abnormality.  No abnormal enhancement. 2. Mild chronic microvascular ischemic changes and parenchymal volume loss of the brain. 3. No orbital mass or inflammatory process identified. CTA neck: 1. Patent carotid and vertebral arteries. No occlusion, dissection, or aneurysm. 2. Dense calcification of right vertebral artery origin with a least moderate underlying stenosis, accurate assessment of luminal stenosis is suboptimal given dense calcification. 3. Otherwise no significant stenosis by NASCET criteria. 4. Mild calcific atherosclerosis of aortic arch and bilateral carotid bifurcations. CTA head: 1. Patent circle of Willis. No large vessel occlusion, aneurysm, or significant stenosis is identified. 2. Mild calcific atherosclerosis of carotid siphons. Electronically Signed   By: Kristine Garbe M.D.   On: 10/05/2017 13:49   Ct Angio Neck W And/or Wo Contrast  Result Date: 10/05/2017 CLINICAL DATA:  76 y/o  M; retinal vascular occlusion. EXAM: CT ANGIOGRAPHY HEAD AND NECK TECHNIQUE: Multidetector CT imaging of the head and neck was performed using the standard protocol during bolus administration of intravenous contrast. Multiplanar CT image reconstructions and MIPs were obtained to evaluate the vascular anatomy. Carotid stenosis measurements (when applicable) are  obtained utilizing NASCET criteria, using the distal internal carotid diameter as the denominator. CONTRAST:  100 cc Isovue 370 COMPARISON:  None. FINDINGS: CT HEAD FINDINGS Brain: No evidence of acute infarction, hemorrhage, hydrocephalus, extra-axial collection or mass lesion/mass effect. Few nonspecific foci of hypoattenuation are present in subcortical and periventricular white matter compatible with mild chronic microvascular ischemic changes. Vascular: As below. Skull: Normal. Negative for fracture or focal lesion. Sinuses: Mastoid tip sclerosis probably representing sequelae of chronic otomastoiditis. Otherwise negative. Orbits: No acute finding. Review of the MIP images confirms the above findings CTA NECK FINDINGS Aortic arch: Standard branching. Imaged portion shows no evidence of aneurysm or dissection. No significant stenosis of the major arch vessel origins. Mild calcific atherosclerosis. Right carotid system: No evidence of dissection, stenosis (50% or greater) or occlusion. Mild calcific atherosclerosis of carotid bifurcation. Left carotid system: No evidence of dissection, stenosis (50% or greater) or occlusion. Mild calcific atherosclerosis of carotid bifurcation. Vertebral arteries: Right dominant. Dense calcification of right vertebral artery origin with a least moderate underlying stenosis, accurate assessment of luminal stenosis is suboptimal given dense calcification. Skeleton: Moderate multilevel cervical spondylosis with disc and  facet degenerative changes. No high-grade bony canal stenosis. Other neck: Negative. Upper chest: Negative. Review of the MIP images confirms the above findings CTA HEAD FINDINGS Anterior circulation: No significant stenosis, proximal occlusion, aneurysm, or vascular malformation. Calcified plaque of right-greater-than-left carotid siphons. Posterior circulation: No significant stenosis, proximal occlusion, aneurysm, or vascular malformation. Venous sinuses: As  permitted by contrast timing, patent. Anatomic variants: Patent anterior communicating artery and right larger than left posterior communicating arteries. Delayed phase: No abnormal intracranial enhancement. Review of the MIP images confirms the above findings IMPRESSION: CT head: 1. No acute intracranial abnormality.  No abnormal enhancement. 2. Mild chronic microvascular ischemic changes and parenchymal volume loss of the brain. 3. No orbital mass or inflammatory process identified. CTA neck: 1. Patent carotid and vertebral arteries. No occlusion, dissection, or aneurysm. 2. Dense calcification of right vertebral artery origin with a least moderate underlying stenosis, accurate assessment of luminal stenosis is suboptimal given dense calcification. 3. Otherwise no significant stenosis by NASCET criteria. 4. Mild calcific atherosclerosis of aortic arch and bilateral carotid bifurcations. CTA head: 1. Patent circle of Willis. No large vessel occlusion, aneurysm, or significant stenosis is identified. 2. Mild calcific atherosclerosis of carotid siphons. Electronically Signed   By: Kristine Garbe M.D.   On: 10/05/2017 13:49   Mr Brain Wo Contrast  Result Date: 10/05/2017 CLINICAL DATA:  76 y/o  M; evaluation for stroke. EXAM: MRI HEAD WITHOUT CONTRAST TECHNIQUE: Multiplanar, multiecho pulse sequences of the brain and surrounding structures were obtained without intravenous contrast. COMPARISON:  10/05/2017 CT angiogram of head and neck. FINDINGS: Brain: Few punctate foci of reduced diffusion within left insula and temporal lobe compatible with areas of acute/ early subacute infarction. The lesions are present at the gray-white junction. Scattered nonspecific foci of T2 FLAIR hyperintense signal abnormality in subcortical and periventricular white matter are compatible with mild chronic microvascular ischemic changes for age. Mild brain parenchymal volume loss. No abnormal susceptibility hypointensity to  indicate intracranial hemorrhage. No focal mass effect, hydrocephalus, extra-axial collection, or herniation. Vascular: Normal flow voids. Skull and upper cervical spine: Normal marrow signal. Sinuses/Orbits: Negative. Other: None. IMPRESSION: 1. Few punctate foci of reduced diffusion in left insula and left temporal lobe compatible with acute/early subacute infarct. Lesions are scattered and peripheral suggesting micro embolic etiology. 2. No acute hemorrhage or mass effect. 3. Mild for age chronic microvascular ischemic changes and mild parenchymal volume loss of the brain. Electronically Signed   By: Kristine Garbe M.D.   On: 10/05/2017 21:23   (Echo, Carotid, EGD, Colonoscopy, ERCP)    Discharge Exam: Vitals:   10/07/17 1305 10/07/17 1345  BP: (!) 145/82 (!) 168/81  Pulse:  (!) 55  Resp:  18  Temp:  97.8 F (36.6 C)  SpO2:  100%   Vitals:   10/07/17 1230 10/07/17 1235 10/07/17 1305 10/07/17 1345  BP: (!) 144/86  (!) 145/82 (!) 168/81  Pulse: 72   (!) 55  Resp: 17   18  Temp:    97.8 F (36.6 C)  TempSrc:    Oral  SpO2: 95% 94%  100%  Weight:      Height:        General: Pt is alert, awake, not in acute distress Eyes:  Left eye quadrantanopsia superior medial quadrant Cardiovascular: RRR, S1/S2 +, no rubs, no gallops Respiratory: CTA bilaterally, no wheezing, no rhonchi Abdominal: Soft, NT, ND, bowel sounds + Extremities: no edema, no cyanosis   The results of significant diagnostics from this hospitalization (including  imaging, microbiology, ancillary and laboratory) are listed below for reference.     Microbiology: No results found for this or any previous visit (from the past 240 hour(s)).   Labs: BNP (last 3 results) No results for input(s): BNP in the last 8760 hours. Basic Metabolic Panel:  Recent Labs Lab 10/05/17 1148 10/07/17 0447  NA 136 138  K 4.2 4.7  CL 104 104  CO2 26 27  GLUCOSE 103* 109*  BUN 12 17  CREATININE 1.23 1.37*  CALCIUM  9.0 8.9   Liver Function Tests:  Recent Labs Lab 10/05/17 1148  AST 36  ALT 32  ALKPHOS 77  BILITOT 0.7  PROT 7.0  ALBUMIN 4.0   No results for input(s): LIPASE, AMYLASE in the last 168 hours. No results for input(s): AMMONIA in the last 168 hours. CBC:  Recent Labs Lab 10/05/17 1148  WBC 5.6  NEUTROABS 2.9  HGB 14.4  HCT 43.1  MCV 96.2  PLT 193   Cardiac Enzymes: No results for input(s): CKTOTAL, CKMB, CKMBINDEX, TROPONINI in the last 168 hours. BNP: Invalid input(s): POCBNP CBG: No results for input(s): GLUCAP in the last 168 hours. D-Dimer No results for input(s): DDIMER in the last 72 hours. Hgb A1c  Recent Labs  10/06/17 0342  HGBA1C 5.9*   Lipid Profile  Recent Labs  10/06/17 0342  CHOL 87  HDL 25*  LDLCALC 28  TRIG 171*  CHOLHDL 3.5   Thyroid function studies No results for input(s): TSH, T4TOTAL, T3FREE, THYROIDAB in the last 72 hours.  Invalid input(s): FREET3 Anemia work up No results for input(s): VITAMINB12, FOLATE, FERRITIN, TIBC, IRON, RETICCTPCT in the last 72 hours. Urinalysis No results found for: COLORURINE, APPEARANCEUR, LABSPEC, [place], GLUCOSEU, HGBUR, BILIRUBINUR, KETONESUR, PROTEINUR, UROBILINOGEN, NITRITE, LEUKOCYTESUR Sepsis Labs Invalid input(s): PROCALCITONIN,  WBC,  LACTICIDVEN Microbiology No results found for this or any previous visit (from the past 240 hour(s)).   Time coordinating discharge: 32 minutes  SIGNED:  Chipper Oman, MD  [facility] 10/07/2017, 8:57 PM  Pager please text page via  [email or web address] Password [email or web address]

## 2017-10-07 NOTE — Progress Notes (Signed)
Chanetta Marshall, NP paged regarding if it is ok for patient to receive 1430 lovenox dose prior to loop recorder implant. Awaiting call back.

## 2017-10-07 NOTE — Anesthesia Procedure Notes (Signed)
Date/Time: 10/07/2017 11:57 AM Performed by: Colin Benton

## 2017-10-07 NOTE — Transfer of Care (Signed)
Immediate Anesthesia Transfer of Care Note  Patient: Zachary Calhoun  Procedure(s) Performed: TRANSESOPHAGEAL ECHOCARDIOGRAM (TEE) (N/A )  Patient Location: Endoscopy Unit  Anesthesia Type:MAC  Level of Consciousness: drowsy and patient cooperative  Airway & Oxygen Therapy: Patient Spontanous Breathing and Patient connected to nasal cannula oxygen  Post-op Assessment: Report given to RN and Post -op Vital signs reviewed and stable  Post vital signs: Reviewed and stable  Last Vitals:  Vitals:   10/07/17 1010 10/07/17 1114  BP: (!) 173/76 (!) 186/92  Pulse: (!) 54   Resp: 18 (!) 21  Temp: 36.6 C 36.6 C  SpO2: 100% 100%    Last Pain:  Vitals:   10/07/17 1114  TempSrc: Oral         Complications: No apparent anesthesia complications

## 2017-10-07 NOTE — Interval H&P Note (Signed)
History and Physical Interval Note:  10/07/2017 11:42 AM  Zachary Calhoun  has presented today for surgery, with the diagnosis of STROKE  The various methods of treatment have been discussed with the patient and family. After consideration of risks, benefits and other options for treatment, the patient has consented to  Procedure(s): TRANSESOPHAGEAL ECHOCARDIOGRAM (TEE) (N/A) as a surgical intervention .  The patient's history has been reviewed, patient examined, no change in status, stable for surgery.  I have reviewed the patient's chart and labs.  Questions were answered to the patient's satisfaction.     Ena Dawley

## 2017-10-07 NOTE — H&P (View-Only) (Signed)
ELECTROPHYSIOLOGY CONSULT NOTE  Patient ID: Zachary Calhoun MRN: [id number], DOB/AGE: Aug 03, 1941   Admit date: 10/05/2017 Date of Consult: 10/07/2017  Primary Physician: Physicians, [facility] Primary Cardiologist: new to [facility] Reason for Consultation: Cryptogenic stroke; recommendations regarding Implantable Loop Recorder  History of Present Illness EP has been asked to evaluate Zachary Calhoun for placement of an implantable loop recorder to monitor for atrial fibrillation by Dr Leonie Man.  The patient was admitted on 10/05/2017 with altered vision.  Imaging demonstrated acute small left temporal stroke felt to be embolic 2/2 unknown source.  Zachary Calhoun has undergone workup for stroke including echocardiogram and carotid dopplers.  The patient has been monitored on telemetry which has demonstrated sinus rhythm with no arrhythmias.  Inpatient stroke work-up is to be completed with a TEE. Zachary Calhoun previously wore both a 24 hour holter as well as an event monitor with no arrhythmias detected.   Echocardiogram this admission demonstrated EF 55-60%, no RWMA, LA 33, mild MR.  Lab work is reviewed.  Prior to admission, the patient denies chest pain, shortness of breath, dizziness, palpitations, or syncope.  They are recovering from their stroke with plans to return home at discharge.    Past Medical History:  Diagnosis Date  . History of gout X 1   "left big toe"  . History of kidney stones    "no OR" (10/05/2017)  . HLD (hyperlipidemia)   . Hypertension      Surgical History:  Past Surgical History:  Procedure Laterality Date  . APPENDECTOMY    . INGUINAL HERNIA REPAIR Bilateral      Prescriptions Prior to Admission  Medication Sig Dispense Refill Last Dose  . aspirin EC 81 MG tablet Take 81 mg by mouth daily.   10/04/2017 at Unknown time  . atorvastatin (LIPITOR) 40 MG tablet Take 40 mg by mouth daily.   10/04/2017 at Unknown time  . captopril (CAPOTEN) 50 MG tablet Take 50 mg by mouth  2 (two) times daily.   10/04/2017 at Unknown time  . Omega-3 Fatty Acids (FISH OIL) 1000 MG CAPS Take 1,000 mg by mouth daily.   10/04/2017 at Unknown time    Inpatient Medications:  . atorvastatin  40 mg Oral Daily  . clopidogrel  75 mg Oral Daily  . enoxaparin (LOVENOX) injection  40 mg Subcutaneous Q24H  . ketorolac  1 drop Both Eyes QID  . omega-3 acid ethyl esters  1 g Oral BID    Allergies: No Known Allergies  Social History   Social History  . Marital status: Married    Spouse name: N/A  . Number of children: N/A  . Years of education: N/A   Occupational History  . Not on file.   Social History Main Topics  . Smoking status: Never Smoker  . Smokeless tobacco: Never Used  . Alcohol use No  . Drug use: No  . Sexual activity: Yes   Other Topics Concern  . Not on file   Social History Narrative  . No narrative on file     History reviewed. No pertinent family history.    Review of Systems: All other systems reviewed and are otherwise negative except as noted above.  Physical Exam: Vitals:   10/06/17 1827 10/06/17 2102 10/07/17 0037 10/07/17 0524  BP: (!) 149/77 (!) 144/71 (!) 141/73 125/71  Pulse: 60 67 69 (!) 55  Resp: 18 18 18 18   Temp: 98.6 F (37 C) 98.3 F (36.8 C) 98.5 F (36.9  C) 98.3 F (36.8 C)  TempSrc: Oral Oral Oral Oral  SpO2: 100% 100% 100% 95%  Weight:      Height:        GEN- The patient is elderly appearing, alert and oriented x 3 today.   Head- normocephalic, atraumatic Eyes-  Sclera clear, conjunctiva pink Ears- hearing intact Oropharynx- clear Neck- supple Lungs- Clear to ausculation bilaterally, normal work of breathing Heart- Regular rate and rhythm  GI- soft, NT, ND, + BS Extremities- no clubbing, cyanosis, or edema MS- no significant deformity or atrophy Skin- no rash or lesion Psych- euthymic mood, full affect   Labs:   Lab Results  Component Value Date   WBC 5.6 10/05/2017   HGB 14.4 10/05/2017   HCT 43.1  10/05/2017   MCV 96.2 10/05/2017   PLT 193 10/05/2017    Recent Labs Lab 10/05/17 1148 10/07/17 0447  NA 136 138  K 4.2 4.7  CL 104 104  CO2 26 27  BUN 12 17  CREATININE 1.23 1.37*  CALCIUM 9.0 8.9  PROT 7.0  --   BILITOT 0.7  --   ALKPHOS 77  --   ALT 32  --   AST 36  --   GLUCOSE 103* 109*     Radiology/Studies: Ct Angio Head W Or Wo Contrast  Result Date: 10/05/2017 CLINICAL DATA:  76 y/o  M; retinal vascular occlusion. EXAM: CT ANGIOGRAPHY HEAD AND NECK TECHNIQUE: Multidetector CT imaging of the head and neck was performed using the standard protocol during bolus administration of intravenous contrast. Multiplanar CT image reconstructions and MIPs were obtained to evaluate the vascular anatomy. Carotid stenosis measurements (when applicable) are obtained utilizing NASCET criteria, using the distal internal carotid diameter as the denominator. CONTRAST:  100 cc Isovue 370 COMPARISON:  None. FINDINGS: CT HEAD FINDINGS Brain: No evidence of acute infarction, hemorrhage, hydrocephalus, extra-axial collection or mass lesion/mass effect. Few nonspecific foci of hypoattenuation are present in subcortical and periventricular white matter compatible with mild chronic microvascular ischemic changes. Vascular: As below. Skull: Normal. Negative for fracture or focal lesion. Sinuses: Mastoid tip sclerosis probably representing sequelae of chronic otomastoiditis. Otherwise negative. Orbits: No acute finding. Review of the MIP images confirms the above findings CTA NECK FINDINGS Aortic arch: Standard branching. Imaged portion shows no evidence of aneurysm or dissection. No significant stenosis of the major arch vessel origins. Mild calcific atherosclerosis. Right carotid system: No evidence of dissection, stenosis (50% or greater) or occlusion. Mild calcific atherosclerosis of carotid bifurcation. Left carotid system: No evidence of dissection, stenosis (50% or greater) or occlusion. Mild calcific  atherosclerosis of carotid bifurcation. Vertebral arteries: Right dominant. Dense calcification of right vertebral artery origin with a least moderate underlying stenosis, accurate assessment of luminal stenosis is suboptimal given dense calcification. Skeleton: Moderate multilevel cervical spondylosis with disc and facet degenerative changes. No high-grade bony canal stenosis. Other neck: Negative. Upper chest: Negative. Review of the MIP images confirms the above findings CTA HEAD FINDINGS Anterior circulation: No significant stenosis, proximal occlusion, aneurysm, or vascular malformation. Calcified plaque of right-greater-than-left carotid siphons. Posterior circulation: No significant stenosis, proximal occlusion, aneurysm, or vascular malformation. Venous sinuses: As permitted by contrast timing, patent. Anatomic variants: Patent anterior communicating artery and right larger than left posterior communicating arteries. Delayed phase: No abnormal intracranial enhancement. Review of the MIP images confirms the above findings IMPRESSION: CT head: 1. No acute intracranial abnormality.  No abnormal enhancement. 2. Mild chronic microvascular ischemic changes and parenchymal volume loss of the brain.  3. No orbital mass or inflammatory process identified. CTA neck: 1. Patent carotid and vertebral arteries. No occlusion, dissection, or aneurysm. 2. Dense calcification of right vertebral artery origin with a least moderate underlying stenosis, accurate assessment of luminal stenosis is suboptimal given dense calcification. 3. Otherwise no significant stenosis by NASCET criteria. 4. Mild calcific atherosclerosis of aortic arch and bilateral carotid bifurcations. CTA head: 1. Patent circle of Willis. No large vessel occlusion, aneurysm, or significant stenosis is identified. 2. Mild calcific atherosclerosis of carotid siphons. Electronically Signed   By: Kristine Garbe M.D.   On: 10/05/2017 13:49   Ct Angio  Neck W And/or Wo Contrast  Result Date: 10/05/2017 CLINICAL DATA:  76 y/o  M; retinal vascular occlusion. EXAM: CT ANGIOGRAPHY HEAD AND NECK TECHNIQUE: Multidetector CT imaging of the head and neck was performed using the standard protocol during bolus administration of intravenous contrast. Multiplanar CT image reconstructions and MIPs were obtained to evaluate the vascular anatomy. Carotid stenosis measurements (when applicable) are obtained utilizing NASCET criteria, using the distal internal carotid diameter as the denominator. CONTRAST:  100 cc Isovue 370 COMPARISON:  None. FINDINGS: CT HEAD FINDINGS Brain: No evidence of acute infarction, hemorrhage, hydrocephalus, extra-axial collection or mass lesion/mass effect. Few nonspecific foci of hypoattenuation are present in subcortical and periventricular white matter compatible with mild chronic microvascular ischemic changes. Vascular: As below. Skull: Normal. Negative for fracture or focal lesion. Sinuses: Mastoid tip sclerosis probably representing sequelae of chronic otomastoiditis. Otherwise negative. Orbits: No acute finding. Review of the MIP images confirms the above findings CTA NECK FINDINGS Aortic arch: Standard branching. Imaged portion shows no evidence of aneurysm or dissection. No significant stenosis of the major arch vessel origins. Mild calcific atherosclerosis. Right carotid system: No evidence of dissection, stenosis (50% or greater) or occlusion. Mild calcific atherosclerosis of carotid bifurcation. Left carotid system: No evidence of dissection, stenosis (50% or greater) or occlusion. Mild calcific atherosclerosis of carotid bifurcation. Vertebral arteries: Right dominant. Dense calcification of right vertebral artery origin with a least moderate underlying stenosis, accurate assessment of luminal stenosis is suboptimal given dense calcification. Skeleton: Moderate multilevel cervical spondylosis with disc and facet degenerative changes.  No high-grade bony canal stenosis. Other neck: Negative. Upper chest: Negative. Review of the MIP images confirms the above findings CTA HEAD FINDINGS Anterior circulation: No significant stenosis, proximal occlusion, aneurysm, or vascular malformation. Calcified plaque of right-greater-than-left carotid siphons. Posterior circulation: No significant stenosis, proximal occlusion, aneurysm, or vascular malformation. Venous sinuses: As permitted by contrast timing, patent. Anatomic variants: Patent anterior communicating artery and right larger than left posterior communicating arteries. Delayed phase: No abnormal intracranial enhancement. Review of the MIP images confirms the above findings IMPRESSION: CT head: 1. No acute intracranial abnormality.  No abnormal enhancement. 2. Mild chronic microvascular ischemic changes and parenchymal volume loss of the brain. 3. No orbital mass or inflammatory process identified. CTA neck: 1. Patent carotid and vertebral arteries. No occlusion, dissection, or aneurysm. 2. Dense calcification of right vertebral artery origin with a least moderate underlying stenosis, accurate assessment of luminal stenosis is suboptimal given dense calcification. 3. Otherwise no significant stenosis by NASCET criteria. 4. Mild calcific atherosclerosis of aortic arch and bilateral carotid bifurcations. CTA head: 1. Patent circle of Willis. No large vessel occlusion, aneurysm, or significant stenosis is identified. 2. Mild calcific atherosclerosis of carotid siphons. Electronically Signed   By: Kristine Garbe M.D.   On: 10/05/2017 13:49   Mr Brain Wo Contrast  Result Date: 10/05/2017 CLINICAL DATA:  76 y/o  M; evaluation for stroke. EXAM: MRI HEAD WITHOUT CONTRAST TECHNIQUE: Multiplanar, multiecho pulse sequences of the brain and surrounding structures were obtained without intravenous contrast. COMPARISON:  10/05/2017 CT angiogram of head and neck. FINDINGS: Brain: Few punctate foci of  reduced diffusion within left insula and temporal lobe compatible with areas of acute/ early subacute infarction. The lesions are present at the gray-white junction. Scattered nonspecific foci of T2 FLAIR hyperintense signal abnormality in subcortical and periventricular white matter are compatible with mild chronic microvascular ischemic changes for age. Mild brain parenchymal volume loss. No abnormal susceptibility hypointensity to indicate intracranial hemorrhage. No focal mass effect, hydrocephalus, extra-axial collection, or herniation. Vascular: Normal flow voids. Skull and upper cervical spine: Normal marrow signal. Sinuses/Orbits: Negative. Other: None. IMPRESSION: 1. Few punctate foci of reduced diffusion in left insula and left temporal lobe compatible with acute/early subacute infarct. Lesions are scattered and peripheral suggesting micro embolic etiology. 2. No acute hemorrhage or mass effect. 3. Mild for age chronic microvascular ischemic changes and mild parenchymal volume loss of the brain. Electronically Signed   By: Kristine Garbe M.D.   On: 10/05/2017 21:23    12-lead ECG sinus rhythm (personally reviewed) All prior EKG's in EPIC reviewed with no documented atrial fibrillation  Telemetry sinus rhythm (personally reviewed)  Assessment and Plan:  1. Cryptogenic stroke The patient presents with cryptogenic stroke.  The patient has a TEE planned for this AM.  I spoke at length with the patient about monitoring for afib with an implantable loop recorder.  Risks, benefits, and alteratives to implantable loop recorder were discussed with the patient today.   At this time, the patient is very clear in their decision to proceed with implantable loop recorder.   Wound care was reviewed with the patient (keep incision clean and dry for 3 days).  Wound check scheduled and entered in AVS.  Please call with questions.   Chanetta Marshall, NP 10/07/2017 9:19 AM  I have seen and examined  this patient with Chanetta Marshall.  Agree with above, note added to reflect my findings.  On exam, RRR, no murmurs, lungs clear.  Zachary Calhoun presented with a cryptogenic stroke with left eye visual field defect.  Echocardiogram shows no major abnormalities.  Plan for TEE today.  If no cause for cryptogenic stroke is found on TEE Stephani Janak plan for link implant.  Risks and benefits discussed.  Risks include bleeding and infection among others.  Zachary Calhoun understands risks and is agreed to the procedure.  Shriyans Kuenzi M. Torryn Fiske MD 10/07/2017 10:12 AM

## 2017-10-07 NOTE — Progress Notes (Signed)
STROKE TEAM PROGRESS NOTE   SUBJECTIVE (INTERVAL HISTORY) His daughter and wife are at the bedside.  Patient is found sitting in bedside chair Overall he feels his condition is gradually improving.  TEE done this morning was normal. Loop recorder is pending. He has no new complaints  OBJECTIVE No results for input(s): GLUCAP in the last 168 hours.  Recent Labs Lab 10/05/17 1148 10/07/17 0447  NA 136 138  K 4.2 4.7  CL 104 104  CO2 26 27  GLUCOSE 103* 109*  BUN 12 17  CREATININE 1.23 1.37*  CALCIUM 9.0 8.9    Recent Labs Lab 10/05/17 1148  AST 36  ALT 32  ALKPHOS 77  BILITOT 0.7  PROT 7.0  ALBUMIN 4.0    Recent Labs Lab 10/05/17 1148  WBC 5.6  NEUTROABS 2.9  HGB 14.4  HCT 43.1  MCV 96.2  PLT 193   No results for input(s): CKTOTAL, CKMB, CKMBINDEX, TROPONINI in the last 168 hours.  Recent Labs  10/07/17 0447  LABPROT 13.6  INR 1.05   No results for input(s): COLORURINE, LABSPEC, PHURINE, GLUCOSEU, HGBUR, BILIRUBINUR, KETONESUR, PROTEINUR, UROBILINOGEN, NITRITE, LEUKOCYTESUR in the last 72 hours.  Invalid input(s): APPERANCEUR     Component Value Date/Time   CHOL 87 10/06/2017 0342   TRIG 171 (H) 10/06/2017 0342   HDL 25 (L) 10/06/2017 0342   CHOLHDL 3.5 10/06/2017 0342   VLDL 34 10/06/2017 0342   LDLCALC 28 10/06/2017 0342   Lab Results  Component Value Date   HGBA1C 5.9 (H) 10/06/2017   No results found for: LABOPIA, COCAINSCRNUR, LABBENZ, AMPHETMU, THCU, LABBARB  No results for input(s): ETH in the last 168 hours.  IMAGING: I have personally reviewed the radiological images below and agree with the radiology interpretations.  Ct Angio Head W Or Wo Contrast  Result Date: 10/05/2017 CLINICAL DATA:  76 y/o  M; retinal vascular occlusion. EXAM: CT ANGIOGRAPHY HEAD AND NECK TECHNIQUE: Multidetector CT imaging of the head and neck was performed using the standard protocol during bolus administration of intravenous contrast. Multiplanar CT image  reconstructions and MIPs were obtained to evaluate the vascular anatomy. Carotid stenosis measurements (when applicable) are obtained utilizing NASCET criteria, using the distal internal carotid diameter as the denominator. CONTRAST:  100 cc Isovue 370 COMPARISON:  None. FINDINGS: CT HEAD FINDINGS Brain: No evidence of acute infarction, hemorrhage, hydrocephalus, extra-axial collection or mass lesion/mass effect. Few nonspecific foci of hypoattenuation are present in subcortical and periventricular white matter compatible with mild chronic microvascular ischemic changes. Vascular: As below. Skull: Normal. Negative for fracture or focal lesion. Sinuses: Mastoid tip sclerosis probably representing sequelae of chronic otomastoiditis. Otherwise negative. Orbits: No acute finding. Review of the MIP images confirms the above findings CTA NECK FINDINGS Aortic arch: Standard branching. Imaged portion shows no evidence of aneurysm or dissection. No significant stenosis of the major arch vessel origins. Mild calcific atherosclerosis. Right carotid system: No evidence of dissection, stenosis (50% or greater) or occlusion. Mild calcific atherosclerosis of carotid bifurcation. Left carotid system: No evidence of dissection, stenosis (50% or greater) or occlusion. Mild calcific atherosclerosis of carotid bifurcation. Vertebral arteries: Right dominant. Dense calcification of right vertebral artery origin with a least moderate underlying stenosis, accurate assessment of luminal stenosis is suboptimal given dense calcification. Skeleton: Moderate multilevel cervical spondylosis with disc and facet degenerative changes. No high-grade bony canal stenosis. Other neck: Negative. Upper chest: Negative. Review of the MIP images confirms the above findings CTA HEAD FINDINGS Anterior circulation: No significant  stenosis, proximal occlusion, aneurysm, or vascular malformation. Calcified plaque of right-greater-than-left carotid siphons.  Posterior circulation: No significant stenosis, proximal occlusion, aneurysm, or vascular malformation. Venous sinuses: As permitted by contrast timing, patent. Anatomic variants: Patent anterior communicating artery and right larger than left posterior communicating arteries. Delayed phase: No abnormal intracranial enhancement. Review of the MIP images confirms the above findings IMPRESSION: CT head: 1. No acute intracranial abnormality.  No abnormal enhancement. 2. Mild chronic microvascular ischemic changes and parenchymal volume loss of the brain. 3. No orbital mass or inflammatory process identified. CTA neck: 1. Patent carotid and vertebral arteries. No occlusion, dissection, or aneurysm. 2. Dense calcification of right vertebral artery origin with a least moderate underlying stenosis, accurate assessment of luminal stenosis is suboptimal given dense calcification. 3. Otherwise no significant stenosis by NASCET criteria. 4. Mild calcific atherosclerosis of aortic arch and bilateral carotid bifurcations. CTA head: 1. Patent circle of Willis. No large vessel occlusion, aneurysm, or significant stenosis is identified. 2. Mild calcific atherosclerosis of carotid siphons. Electronically Signed   By: Kristine Garbe M.D.   On: 10/05/2017 13:49   Ct Angio Neck W And/or Wo Contrast  Result Date: 10/05/2017 CLINICAL DATA:  76 y/o  M; retinal vascular occlusion. EXAM: CT ANGIOGRAPHY HEAD AND NECK TECHNIQUE: Multidetector CT imaging of the head and neck was performed using the standard protocol during bolus administration of intravenous contrast. Multiplanar CT image reconstructions and MIPs were obtained to evaluate the vascular anatomy. Carotid stenosis measurements (when applicable) are obtained utilizing NASCET criteria, using the distal internal carotid diameter as the denominator. CONTRAST:  100 cc Isovue 370 COMPARISON:  None. FINDINGS: CT HEAD FINDINGS Brain: No evidence of acute infarction,  hemorrhage, hydrocephalus, extra-axial collection or mass lesion/mass effect. Few nonspecific foci of hypoattenuation are present in subcortical and periventricular white matter compatible with mild chronic microvascular ischemic changes. Vascular: As below. Skull: Normal. Negative for fracture or focal lesion. Sinuses: Mastoid tip sclerosis probably representing sequelae of chronic otomastoiditis. Otherwise negative. Orbits: No acute finding. Review of the MIP images confirms the above findings CTA NECK FINDINGS Aortic arch: Standard branching. Imaged portion shows no evidence of aneurysm or dissection. No significant stenosis of the major arch vessel origins. Mild calcific atherosclerosis. Right carotid system: No evidence of dissection, stenosis (50% or greater) or occlusion. Mild calcific atherosclerosis of carotid bifurcation. Left carotid system: No evidence of dissection, stenosis (50% or greater) or occlusion. Mild calcific atherosclerosis of carotid bifurcation. Vertebral arteries: Right dominant. Dense calcification of right vertebral artery origin with a least moderate underlying stenosis, accurate assessment of luminal stenosis is suboptimal given dense calcification. Skeleton: Moderate multilevel cervical spondylosis with disc and facet degenerative changes. No high-grade bony canal stenosis. Other neck: Negative. Upper chest: Negative. Review of the MIP images confirms the above findings CTA HEAD FINDINGS Anterior circulation: No significant stenosis, proximal occlusion, aneurysm, or vascular malformation. Calcified plaque of right-greater-than-left carotid siphons. Posterior circulation: No significant stenosis, proximal occlusion, aneurysm, or vascular malformation. Venous sinuses: As permitted by contrast timing, patent. Anatomic variants: Patent anterior communicating artery and right larger than left posterior communicating arteries. Delayed phase: No abnormal intracranial enhancement. Review of  the MIP images confirms the above findings IMPRESSION: CT head: 1. No acute intracranial abnormality.  No abnormal enhancement. 2. Mild chronic microvascular ischemic changes and parenchymal volume loss of the brain. 3. No orbital mass or inflammatory process identified. CTA neck: 1. Patent carotid and vertebral arteries. No occlusion, dissection, or aneurysm. 2. Dense calcification of right vertebral  artery origin with a least moderate underlying stenosis, accurate assessment of luminal stenosis is suboptimal given dense calcification. 3. Otherwise no significant stenosis by NASCET criteria. 4. Mild calcific atherosclerosis of aortic arch and bilateral carotid bifurcations. CTA head: 1. Patent circle of Willis. No large vessel occlusion, aneurysm, or significant stenosis is identified. 2. Mild calcific atherosclerosis of carotid siphons. Electronically Signed   By: Kristine Garbe M.D.   On: 10/05/2017 13:49   Mr Brain Wo Contrast  Result Date: 10/05/2017 CLINICAL DATA:  76 y/o  M; evaluation for stroke. EXAM: MRI HEAD WITHOUT CONTRAST TECHNIQUE: Multiplanar, multiecho pulse sequences of the brain and surrounding structures were obtained without intravenous contrast. COMPARISON:  10/05/2017 CT angiogram of head and neck. FINDINGS: Brain: Few punctate foci of reduced diffusion within left insula and temporal lobe compatible with areas of acute/ early subacute infarction. The lesions are present at the gray-white junction. Scattered nonspecific foci of T2 FLAIR hyperintense signal abnormality in subcortical and periventricular white matter are compatible with mild chronic microvascular ischemic changes for age. Mild brain parenchymal volume loss. No abnormal susceptibility hypointensity to indicate intracranial hemorrhage. No focal mass effect, hydrocephalus, extra-axial collection, or herniation. Vascular: Normal flow voids. Skull and upper cervical spine: Normal marrow signal. Sinuses/Orbits:  Negative. Other: None. IMPRESSION: 1. Few punctate foci of reduced diffusion in left insula and left temporal lobe compatible with acute/early subacute infarct. Lesions are scattered and peripheral suggesting micro embolic etiology. 2. No acute hemorrhage or mass effect. 3. Mild for age chronic microvascular ischemic changes and mild parenchymal volume loss of the brain. Electronically Signed   By: Kristine Garbe M.D.   On: 10/05/2017 21:23    PHYSICAL EXAM Temp:  [97.7 F (36.5 C)-98.6 F (37 C)] 97.8 F (36.6 C) (11/02 1345) Pulse Rate:  [54-76] 55 (11/02 1345) Resp:  [17-26] 18 (11/02 1345) BP: (120-186)/(53-92) 168/81 (11/02 1345) SpO2:  [94 %-100 %] 100 % (11/02 1345) Weight:  [182 lb (82.6 kg)] 182 lb (82.6 kg) (11/02 1114)  General - Well nourished, well developed, in no apparent distress Respiratory - Lungs clear bilaterally. No wheezing. Cardiovascular - Regular rate and rhythm with no murmur  Neurological Examination Mental Status: Alert, oriented, thought content appropriate.  Speech fluent without evidence of aphasia.  Able to follow 3 step commands without difficulty. Cranial Nerves: II: Left eye: quadrantanopsia in the superior medial quadrant only III,IV, VI: ptosis not present, extra-ocular motions intact bilaterally, pupils equal, round, reactive to light and accommodation V,VII: smile symmetric, facial light touch sensation normal bilaterally VIII: hearing normal bilaterally IX,X: uvula rises symmetrically XI: bilateral shoulder shrug XII: midline tongue extension Motor: Right :  Upper extremity   5/5                                      Left:     Upper extremity   5/5             Lower extremity   5/5                                                  Lower extremity   5/5 Tone and bulk:normal tone throughout; no atrophy noted Sensory:  light touch intact throughout, bilaterally Cerebellar: normal finger-to-nose, normal rapid alternating movements  and  normal heel-to-shin test Gait: Not tested  ASSESSMENT AND PLAN: Mr. Zachary Calhoun is a 76 y.o. male with PMH of HTN, HLD, admitted for sudden onset of left medial inferior quadrantanopsia and abnormalities visualized in the dilated exam from his ophthalmologist.  Likely Left branch retinal artery occlusion Acute, small Left Temporal Stroke: secondary to embolic source, likely undiagnosed AFIB Likely undiagnosed TIA 2 months ago   Resultant:     positive medial inferior quadrantanopsia of the Left eye   2D Echo: Study completed and Results pending   Scheduled for TEE tomorrow at noon with Dr. Meda Coffee     VTE prophylaxis: SCD's        aspirin 81 mg daily prior to admission, now on clopidogrel 75 mg daily.        Please discharge patient on clopidogrel 75 mg daily  Patient counseled to be compliant with his antithrombotic medications        Therapy recommendations:  Likely HOME with Outpatient OT       Disposition: HOME          Recommendations:    Await loop recorder insertion  May be discharged home once  Loop recorder has been implanted..  Ongoing aggressive stroke risk factor management        Follow up Appointments:    PCP follow up   Follow up with [facility], Dr Leonie Man in 6 weeks  Diabetes:  HgbA1c  5.9   goal < 7.0  Controlled  Hypertension: Stable Permissive hypertension (OK if <220/120) for 24-48 hours post stroke and then gradually normalized within 5-7 days.  Long term BP goal normotensive. May slowly restart home B/P medications after 48 hours with close PCP follow up.  Hyperlipidemia:  Home meds:  Lipitor 40 mg  LDL      28   , goal < 70  Now on Lipitor to 40 mg daily  Continue statin at discharge  Other Stroke Risk Factors:  Advanced age  Hospital day # 1  Renie Ora Stroke Neurology Team 10/07/2017 3:23 PM I have personally examined this patient, reviewed notes, independently viewed imaging studies,  participated in medical decision making and plan of care.ROS completed by me personally and pertinent positives fully documented  I have made any additions or clarifications directly to the above note. Agree with note above. ,  He presented with retinal artery branch occlusion as well as silent left MCA infarct likely of embolic etiology and atrial fibrillation seems most likely.  Recommend   loop recorder    Continue Plavix for stroke prevention.  Long discussion with patient and family and answered questions.  Stroke team will sign off. Follow-up as an outpatient in stroke clinic in 6 weeks.     Antony Contras, MD Medical Director [facility] Pager: [phone] 10/07/2017 3:23 PM  Neurology to sign-off at this time. Please call with any further questions or concerns. Thank you for this consultation.  To contact Stroke Continuity provider, please refer to [email or web address] After hours, contact General Neurology

## 2017-10-07 NOTE — Consult Note (Signed)
ELECTROPHYSIOLOGY CONSULT NOTE  Patient ID: Zachary Calhoun MRN: [id number], DOB/AGE: [birth year]   Admit date: 10/05/2017 Date of Consult: 10/07/2017  Primary Physician: Physicians, [facility] Primary Cardiologist: new to [facility] Reason for Consultation: Cryptogenic stroke; recommendations regarding Implantable Loop Recorder  History of Present Illness EP has been asked to evaluate Zachary Calhoun for placement of an implantable loop recorder to monitor for atrial fibrillation by Dr Leonie Man.  The patient was admitted on 10/05/2017 with altered vision.  Imaging demonstrated acute small left temporal stroke felt to be embolic 2/2 unknown source.  He has undergone workup for stroke including echocardiogram and carotid dopplers.  The patient has been monitored on telemetry which has demonstrated sinus rhythm with no arrhythmias.  Inpatient stroke work-up is to be completed with a TEE. He previously wore both a 24 hour holter as well as an event monitor with no arrhythmias detected.   Echocardiogram this admission demonstrated EF 55-60%, no RWMA, LA 33, mild MR.  Lab work is reviewed.  Prior to admission, the patient denies chest pain, shortness of breath, dizziness, palpitations, or syncope.  They are recovering from their stroke with plans to return home at discharge.    Past Medical History:  Diagnosis Date  . History of gout X 1   "left big toe"  . History of kidney stones    "no OR" (10/05/2017)  . HLD (hyperlipidemia)   . Hypertension      Surgical History:  Past Surgical History:  Procedure Laterality Date  . APPENDECTOMY    . INGUINAL HERNIA REPAIR Bilateral      Prescriptions Prior to Admission  Medication Sig Dispense Refill Last Dose  . aspirin EC 81 MG tablet Take 81 mg by mouth daily.   10/04/2017 at Unknown time  . atorvastatin (LIPITOR) 40 MG tablet Take 40 mg by mouth daily.   10/04/2017 at Unknown time  . captopril (CAPOTEN) 50 MG tablet Take 50 mg by mouth  2 (two) times daily.   10/04/2017 at Unknown time  . Omega-3 Fatty Acids (FISH OIL) 1000 MG CAPS Take 1,000 mg by mouth daily.   10/04/2017 at Unknown time    Inpatient Medications:  . atorvastatin  40 mg Oral Daily  . clopidogrel  75 mg Oral Daily  . enoxaparin (LOVENOX) injection  40 mg Subcutaneous Q24H  . ketorolac  1 drop Both Eyes QID  . omega-3 acid ethyl esters  1 g Oral BID    Allergies: No Known Allergies  Social History   Social History  . Marital status: Married    Spouse name: N/A  . Number of children: N/A  . Years of education: N/A   Occupational History  . Not on file.   Social History Main Topics  . Smoking status: Never Smoker  . Smokeless tobacco: Never Used  . Alcohol use No  . Drug use: No  . Sexual activity: Yes   Other Topics Concern  . Not on file   Social History Narrative  . No narrative on file     History reviewed. No pertinent family history.    Review of Systems: All other systems reviewed and are otherwise negative except as noted above.  Physical Exam: Vitals:   10/06/17 1827 10/06/17 2102 10/07/17 0037 10/07/17 0524  BP: (!) 149/77 (!) 144/71 (!) 141/73 125/71  Pulse: 60 67 69 (!) 55  Resp: 18 18 18 18   Temp: 98.6 F (37 C) 98.3 F (36.8 C) 98.5 F (36.9  C) 98.3 F (36.8 C)  TempSrc: Oral Oral Oral Oral  SpO2: 100% 100% 100% 95%  Weight:      Height:        GEN- The patient is elderly appearing, alert and oriented x 3 today.   Head- normocephalic, atraumatic Eyes-  Sclera clear, conjunctiva pink Ears- hearing intact Oropharynx- clear Neck- supple Lungs- Clear to ausculation bilaterally, normal work of breathing Heart- Regular rate and rhythm  GI- soft, NT, ND, + BS Extremities- no clubbing, cyanosis, or edema MS- no significant deformity or atrophy Skin- no rash or lesion Psych- euthymic mood, full affect   Labs:   Lab Results  Component Value Date   WBC 5.6 10/05/2017   HGB 14.4 10/05/2017   HCT 43.1  10/05/2017   MCV 96.2 10/05/2017   PLT 193 10/05/2017    Recent Labs Lab 10/05/17 1148 10/07/17 0447  NA 136 138  K 4.2 4.7  CL 104 104  CO2 26 27  BUN 12 17  CREATININE 1.23 1.37*  CALCIUM 9.0 8.9  PROT 7.0  --   BILITOT 0.7  --   ALKPHOS 77  --   ALT 32  --   AST 36  --   GLUCOSE 103* 109*     Radiology/Studies: Ct Angio Head W Or Wo Contrast  Result Date: 10/05/2017 CLINICAL DATA:  76 y/o  M; retinal vascular occlusion. EXAM: CT ANGIOGRAPHY HEAD AND NECK TECHNIQUE: Multidetector CT imaging of the head and neck was performed using the standard protocol during bolus administration of intravenous contrast. Multiplanar CT image reconstructions and MIPs were obtained to evaluate the vascular anatomy. Carotid stenosis measurements (when applicable) are obtained utilizing NASCET criteria, using the distal internal carotid diameter as the denominator. CONTRAST:  100 cc Isovue 370 COMPARISON:  None. FINDINGS: CT HEAD FINDINGS Brain: No evidence of acute infarction, hemorrhage, hydrocephalus, extra-axial collection or mass lesion/mass effect. Few nonspecific foci of hypoattenuation are present in subcortical and periventricular white matter compatible with mild chronic microvascular ischemic changes. Vascular: As below. Skull: Normal. Negative for fracture or focal lesion. Sinuses: Mastoid tip sclerosis probably representing sequelae of chronic otomastoiditis. Otherwise negative. Orbits: No acute finding. Review of the MIP images confirms the above findings CTA NECK FINDINGS Aortic arch: Standard branching. Imaged portion shows no evidence of aneurysm or dissection. No significant stenosis of the major arch vessel origins. Mild calcific atherosclerosis. Right carotid system: No evidence of dissection, stenosis (50% or greater) or occlusion. Mild calcific atherosclerosis of carotid bifurcation. Left carotid system: No evidence of dissection, stenosis (50% or greater) or occlusion. Mild calcific  atherosclerosis of carotid bifurcation. Vertebral arteries: Right dominant. Dense calcification of right vertebral artery origin with a least moderate underlying stenosis, accurate assessment of luminal stenosis is suboptimal given dense calcification. Skeleton: Moderate multilevel cervical spondylosis with disc and facet degenerative changes. No high-grade bony canal stenosis. Other neck: Negative. Upper chest: Negative. Review of the MIP images confirms the above findings CTA HEAD FINDINGS Anterior circulation: No significant stenosis, proximal occlusion, aneurysm, or vascular malformation. Calcified plaque of right-greater-than-left carotid siphons. Posterior circulation: No significant stenosis, proximal occlusion, aneurysm, or vascular malformation. Venous sinuses: As permitted by contrast timing, patent. Anatomic variants: Patent anterior communicating artery and right larger than left posterior communicating arteries. Delayed phase: No abnormal intracranial enhancement. Review of the MIP images confirms the above findings IMPRESSION: CT head: 1. No acute intracranial abnormality.  No abnormal enhancement. 2. Mild chronic microvascular ischemic changes and parenchymal volume loss of the brain.  3. No orbital mass or inflammatory process identified. CTA neck: 1. Patent carotid and vertebral arteries. No occlusion, dissection, or aneurysm. 2. Dense calcification of right vertebral artery origin with a least moderate underlying stenosis, accurate assessment of luminal stenosis is suboptimal given dense calcification. 3. Otherwise no significant stenosis by NASCET criteria. 4. Mild calcific atherosclerosis of aortic arch and bilateral carotid bifurcations. CTA head: 1. Patent circle of Willis. No large vessel occlusion, aneurysm, or significant stenosis is identified. 2. Mild calcific atherosclerosis of carotid siphons. Electronically Signed   By: Kristine Garbe M.D.   On: 10/05/2017 13:49   Ct Angio  Neck W And/or Wo Contrast  Result Date: 10/05/2017 CLINICAL DATA:  76 y/o  M; retinal vascular occlusion. EXAM: CT ANGIOGRAPHY HEAD AND NECK TECHNIQUE: Multidetector CT imaging of the head and neck was performed using the standard protocol during bolus administration of intravenous contrast. Multiplanar CT image reconstructions and MIPs were obtained to evaluate the vascular anatomy. Carotid stenosis measurements (when applicable) are obtained utilizing NASCET criteria, using the distal internal carotid diameter as the denominator. CONTRAST:  100 cc Isovue 370 COMPARISON:  None. FINDINGS: CT HEAD FINDINGS Brain: No evidence of acute infarction, hemorrhage, hydrocephalus, extra-axial collection or mass lesion/mass effect. Few nonspecific foci of hypoattenuation are present in subcortical and periventricular white matter compatible with mild chronic microvascular ischemic changes. Vascular: As below. Skull: Normal. Negative for fracture or focal lesion. Sinuses: Mastoid tip sclerosis probably representing sequelae of chronic otomastoiditis. Otherwise negative. Orbits: No acute finding. Review of the MIP images confirms the above findings CTA NECK FINDINGS Aortic arch: Standard branching. Imaged portion shows no evidence of aneurysm or dissection. No significant stenosis of the major arch vessel origins. Mild calcific atherosclerosis. Right carotid system: No evidence of dissection, stenosis (50% or greater) or occlusion. Mild calcific atherosclerosis of carotid bifurcation. Left carotid system: No evidence of dissection, stenosis (50% or greater) or occlusion. Mild calcific atherosclerosis of carotid bifurcation. Vertebral arteries: Right dominant. Dense calcification of right vertebral artery origin with a least moderate underlying stenosis, accurate assessment of luminal stenosis is suboptimal given dense calcification. Skeleton: Moderate multilevel cervical spondylosis with disc and facet degenerative changes.  No high-grade bony canal stenosis. Other neck: Negative. Upper chest: Negative. Review of the MIP images confirms the above findings CTA HEAD FINDINGS Anterior circulation: No significant stenosis, proximal occlusion, aneurysm, or vascular malformation. Calcified plaque of right-greater-than-left carotid siphons. Posterior circulation: No significant stenosis, proximal occlusion, aneurysm, or vascular malformation. Venous sinuses: As permitted by contrast timing, patent. Anatomic variants: Patent anterior communicating artery and right larger than left posterior communicating arteries. Delayed phase: No abnormal intracranial enhancement. Review of the MIP images confirms the above findings IMPRESSION: CT head: 1. No acute intracranial abnormality.  No abnormal enhancement. 2. Mild chronic microvascular ischemic changes and parenchymal volume loss of the brain. 3. No orbital mass or inflammatory process identified. CTA neck: 1. Patent carotid and vertebral arteries. No occlusion, dissection, or aneurysm. 2. Dense calcification of right vertebral artery origin with a least moderate underlying stenosis, accurate assessment of luminal stenosis is suboptimal given dense calcification. 3. Otherwise no significant stenosis by NASCET criteria. 4. Mild calcific atherosclerosis of aortic arch and bilateral carotid bifurcations. CTA head: 1. Patent circle of Willis. No large vessel occlusion, aneurysm, or significant stenosis is identified. 2. Mild calcific atherosclerosis of carotid siphons. Electronically Signed   By: Kristine Garbe M.D.   On: 10/05/2017 13:49   Mr Brain Wo Contrast  Result Date: 10/05/2017 CLINICAL DATA:  76 y/o  M; evaluation for stroke. EXAM: MRI HEAD WITHOUT CONTRAST TECHNIQUE: Multiplanar, multiecho pulse sequences of the brain and surrounding structures were obtained without intravenous contrast. COMPARISON:  10/05/2017 CT angiogram of head and neck. FINDINGS: Brain: Few punctate foci of  reduced diffusion within left insula and temporal lobe compatible with areas of acute/ early subacute infarction. The lesions are present at the gray-white junction. Scattered nonspecific foci of T2 FLAIR hyperintense signal abnormality in subcortical and periventricular white matter are compatible with mild chronic microvascular ischemic changes for age. Mild brain parenchymal volume loss. No abnormal susceptibility hypointensity to indicate intracranial hemorrhage. No focal mass effect, hydrocephalus, extra-axial collection, or herniation. Vascular: Normal flow voids. Skull and upper cervical spine: Normal marrow signal. Sinuses/Orbits: Negative. Other: None. IMPRESSION: 1. Few punctate foci of reduced diffusion in left insula and left temporal lobe compatible with acute/early subacute infarct. Lesions are scattered and peripheral suggesting micro embolic etiology. 2. No acute hemorrhage or mass effect. 3. Mild for age chronic microvascular ischemic changes and mild parenchymal volume loss of the brain. Electronically Signed   By: Kristine Garbe M.D.   On: 10/05/2017 21:23    12-lead ECG sinus rhythm (personally reviewed) All prior EKG's in EPIC reviewed with no documented atrial fibrillation  Telemetry sinus rhythm (personally reviewed)  Assessment and Plan:  1. Cryptogenic stroke The patient presents with cryptogenic stroke.  The patient has a TEE planned for this AM.  I spoke at length with the patient about monitoring for afib with an implantable loop recorder.  Risks, benefits, and alteratives to implantable loop recorder were discussed with the patient today.   At this time, the patient is very clear in their decision to proceed with implantable loop recorder.   Wound care was reviewed with the patient (keep incision clean and dry for 3 days).  Wound check scheduled and entered in AVS.  Please call with questions.   Chanetta Marshall, NP 10/07/2017 9:19 AM  I have seen and examined  this patient with Chanetta Marshall.  Agree with above, note added to reflect my findings.  On exam, RRR, no murmurs, lungs clear.  He presented with a cryptogenic stroke with left eye visual field defect.  Echocardiogram shows no major abnormalities.  Plan for TEE today.  If no cause for cryptogenic stroke is found on TEE will plan for link implant.  Risks and benefits discussed.  Risks include bleeding and infection among others.  He understands risks and is agreed to the procedure.  Will M. Camnitz MD 10/07/2017 10:12 AM

## 2017-10-07 NOTE — CV Procedure (Signed)
     Transesophageal Echocardiogram Note  Zachary Calhoun [id number] Feb 17, 1941  Procedure: Transesophageal Echocardiogram Indications: Stroke  Procedure Details Consent: Obtained Time Out: Verified patient identification, verified procedure, site/side was marked, verified correct patient position, special equipment/implants available, Radiology Safety Procedures followed,  medications/allergies/relevent history reviewed, required imaging and test results available.  Performed  Medications: Iv propofol and lidocaine administered by anesthesia staff for sedation.    - Left ventricle: Systolic function was normal. The estimated   ejection fraction was in the range of 55% to 60%. Wall motion was   normal; there were no regional wall motion abnormalities. - Left atrium: The atrium was dilated. No evidence of thrombus in   the atrial cavity or appendage. No evidence of thrombus in the   atrial cavity or appendage. - Right ventricle: Systolic function was normal. - Right atrium: No evidence of thrombus in the atrial cavity or   appendage. - Atrial septum: No defect or patent foramen ovale was identified. - Pericardium, extracardiac: There was no pericardial effusion.  Impressions:  - No cardiac source of emboli was indentified.   Negative bubble study. No PFO.   Complications: No apparent complications Patient did tolerate procedure well.  Ena Dawley, MD, [facility] 10/07/2017, 1:52 PM

## 2017-10-09 ENCOUNTER — Encounter (HOSPITAL_COMMUNITY): Payer: Self-pay | Admitting: Cardiology

## 2017-10-10 ENCOUNTER — Encounter (HOSPITAL_COMMUNITY): Payer: Self-pay | Admitting: Cardiology

## 2017-10-14 NOTE — ED Provider Notes (Signed)
[place] [facility]W PROGRESSIVE CARE Provider Note   CSN: [id number] Arrival date & time: 10/05/17  1120     History   Chief Complaint Chief Complaint  Patient presents with  . Eye Problem    HPI Zachary Calhoun is a 76 y.o. male. Chief complaint as decreased vision left eye.  HPI patient presents for evaluation after being transferred from [place] by associates with left eye visual disturbances. Symptoms started last night at 8:30. Went to his 40 and had a dilated exam his optometrist is sent over for concern about retinal abnormalities.  Past Medical History:  Diagnosis Date  . History of gout X 1   "left big toe"  . History of kidney stones    "no OR" (10/05/2017)  . HLD (hyperlipidemia)   . Hypertension     Patient Active Problem List   Diagnosis Date Noted  . Ischemic stroke ([place])   . Acute loss of vision, left 10/05/2017  . HLD (hyperlipidemia) 10/05/2017  . Hypertension 10/05/2017    Past Surgical History:  Procedure Laterality Date  . APPENDECTOMY    . INGUINAL HERNIA REPAIR Bilateral        Home Medications    Prior to Admission medications   Medication Sig Start Date End Date Taking? Authorizing Provider  atorvastatin (LIPITOR) 40 MG tablet Take 40 mg by mouth daily. 09/09/17  Yes [provider]  Omega-3 Fatty Acids (FISH OIL) 1000 MG CAPS Take 1,000 mg by mouth daily.   Yes [provider]  clopidogrel (PLAVIX) 75 MG tablet Take 1 tablet (75 mg total) by mouth daily. 10/08/17   Doreatha Lew, MD    Family History History reviewed. No pertinent family history.  Social History Social History   Tobacco Use  . Smoking status: Never Smoker  . Smokeless tobacco: Never Used  Substance Use Topics  . Alcohol use: No  . Drug use: No     Allergies   Patient has no known allergies.   Review of Systems Review of Systems  Constitutional: Negative for appetite change, chills, diaphoresis, fatigue and fever.  HENT:  Negative for mouth sores, sore throat and trouble swallowing.   Eyes: Positive for visual disturbance.  Respiratory: Negative for cough, chest tightness, shortness of breath and wheezing.   Cardiovascular: Negative for chest pain.  Gastrointestinal: Negative for abdominal distention, abdominal pain, diarrhea, nausea and vomiting.  Endocrine: Negative for polydipsia, polyphagia and polyuria.  Genitourinary: Negative for dysuria, frequency and hematuria.  Musculoskeletal: Negative for gait problem.  Skin: Negative for color change, pallor and rash.  Neurological: Negative for dizziness, syncope, light-headedness and headaches.  Hematological: Does not bruise/bleed easily.  Psychiatric/Behavioral: Negative for behavioral problems and confusion.     Physical Exam Updated Vital Signs BP (!) 168/81 (BP Location: Right Arm)   Pulse (!) 55   Temp 97.8 F (36.6 C) (Oral)   Resp 18   Ht 5\' 8"  (1.727 m)   Wt 82.6 kg (182 lb)   SpO2 100%   BMI 27.67 kg/m   Physical Exam Gen.: Awake and alert HEENT negative. See detailed eye exam. Neck no bruits. Heart regular rate and rhythm. Lungs clear. Abdomen soft. Skin normal. Extremities negative. Neuro no focal deficits. Vision reported on patient normal right eye. Left eye to my exam he has a left lateral inferior retinal quadrantanopsia with for areas of punctate erythema of the retina consistent with branch retinal artery occlusions. Note his eyes dilated chemically from his optometry visit today.  ED Treatments / Results  Labs (all labs ordered are listed, but only abnormal results are displayed) Labs Reviewed  COMPREHENSIVE METABOLIC PANEL - Abnormal; Notable for the following components:      Result Value   Glucose, Bld 103 (*)    GFR calc non Af Amer 55 (*)    All other components within normal limits  HEMOGLOBIN A1C - Abnormal; Notable for the following components:   Hgb A1c MFr Bld 5.9 (*)    All other components within normal  limits  LIPID PANEL - Abnormal; Notable for the following components:   Triglycerides 171 (*)    HDL 25 (*)    All other components within normal limits  BASIC METABOLIC PANEL - Abnormal; Notable for the following components:   Glucose, Bld 109 (*)    Creatinine, Ser 1.37 (*)    GFR calc non Af Amer 49 (*)    GFR calc Af Amer 56 (*)    All other components within normal limits  CBC WITH DIFFERENTIAL/PLATELET  SEDIMENTATION RATE  PROTIME-INR    EKG  EKG Interpretation  Date/Time:  Wednesday October 05 2017 11:28:46 EDT Ventricular Rate:  58 PR Interval:    QRS Duration: 100 QT Interval:  410 QTC Calculation: 403 R Axis:   52 Text Interpretation:  Sinus rhythm Atrial premature complex Otherwise within normal limits Confirmed by Carmin Muskrat [phone]) on 10/06/2017 9:07:35 PM       Radiology No results found.  Procedures Procedures (including critical care time)  Medications Ordered in ED Medications  iopamidol (ISOVUE-370) 76 % injection (not administered)  iopamidol (ISOVUE-370) 76 % injection (50 mLs  Contrast Given 10/05/17 1257)   stroke: mapping our early stages of recovery book (1 each Does not apply Given 10/05/17 1453)     Initial Impression / Assessment and Plan / ED Course  I have reviewed the triage vital signs and the nursing notes.  Pertinent labs & imaging results that were available during my care of the patient were reviewed by me and considered in my medical decision making (see chart for details).    Discussed with neurology and ophthalmology. We'll be admitted to medicine.  Final Clinical Impressions(s) / ED Diagnoses   Final diagnoses:  Ischemic stroke ([place])  Acute loss of vision, left    ED Discharge Orders        Ordered    clopidogrel (PLAVIX) 75 MG tablet  Daily     10/07/17 1708    Increase activity slowly     10/07/17 1708    Diet - low sodium heart healthy     10/07/17 1708    Discharge instructions    Comments:  Resume  Captopril in 48 hrs   10/07/17 1708    Call MD for:  temperature >100.4     10/07/17 1708    Call MD for:  persistant nausea and vomiting     10/07/17 1708    Call MD for:  severe uncontrolled pain     10/07/17 1708    Call MD for:  redness, tenderness, or signs of infection (pain, swelling, redness, odor or green/yellow discharge around incision site)     10/07/17 1708    Call MD for:  difficulty breathing, headache or visual disturbances     10/07/17 1708    Call MD for:  hives     10/07/17 1708    Call MD for:  persistant dizziness or light-headedness     10/07/17 1708  Call MD for:  extreme fatigue     10/07/17 1708    Ambulatory referral to Neurology    Comments:  An appointment is requested in approximately: 6 weeks Follow up with stroke clinic (Dr Leonie Man preferred, if not available, then consider Cecille Rubin, Dr Erlinda Hong, [facility] or Jaynee Eagles whoever is available) at [facility] in about 6-8 weeks. Thanks.   10/06/17 1618       Tanna Furry, MD 10/14/17 2349

## 2017-10-17 ENCOUNTER — Ambulatory Visit (INDEPENDENT_AMBULATORY_CARE_PROVIDER_SITE_OTHER): Payer: Self-pay | Admitting: *Deleted

## 2017-10-17 DIAGNOSIS — I639 Cerebral infarction, unspecified: Secondary | ICD-10-CM

## 2017-10-17 LAB — CUP PACEART INCLINIC DEVICE CHECK
Implantable Pulse Generator Implant Date: 20181102
MDC IDC SESS DTM: 20181112125917

## 2017-10-17 NOTE — Progress Notes (Signed)
Wound check in clinic s/p ILR implant. Steri strips removed. Wound well healed without redness or edema. Incision edges approximated. Normal ILR device function. Battery status: GOOD. R-waves 0.79mV. 0 symptom episodes, 0 tachy episodes, 0pause episodes, 0 brady episodes. 0 AF episodes (0% burden). Monthly summary reports and ROV with WC PRN. Patient education completed including wound care and remote monitoring.

## 2017-10-18 DIAGNOSIS — E782 Mixed hyperlipidemia: Secondary | ICD-10-CM | POA: Diagnosis not present

## 2017-10-18 DIAGNOSIS — I69398 Other sequelae of cerebral infarction: Secondary | ICD-10-CM | POA: Diagnosis not present

## 2017-10-18 DIAGNOSIS — E119 Type 2 diabetes mellitus without complications: Secondary | ICD-10-CM | POA: Diagnosis not present

## 2017-10-18 DIAGNOSIS — I10 Essential (primary) hypertension: Secondary | ICD-10-CM | POA: Diagnosis not present

## 2017-10-18 DIAGNOSIS — I63342 Cerebral infarction due to thrombosis of left cerebellar artery: Secondary | ICD-10-CM | POA: Diagnosis not present

## 2017-10-21 DIAGNOSIS — H34212 Partial retinal artery occlusion, left eye: Secondary | ICD-10-CM | POA: Diagnosis not present

## 2017-10-21 DIAGNOSIS — H2513 Age-related nuclear cataract, bilateral: Secondary | ICD-10-CM | POA: Diagnosis not present

## 2017-11-07 ENCOUNTER — Ambulatory Visit (INDEPENDENT_AMBULATORY_CARE_PROVIDER_SITE_OTHER): Payer: Medicare Other | Admitting: *Deleted

## 2017-11-07 DIAGNOSIS — I639 Cerebral infarction, unspecified: Secondary | ICD-10-CM | POA: Diagnosis not present

## 2017-11-07 NOTE — Progress Notes (Signed)
[facility] Summary Report / Loop Recorder 

## 2017-11-15 LAB — CUP PACEART REMOTE DEVICE CHECK
Implantable Pulse Generator Implant Date: 20181102
MDC IDC SESS DTM: 20181202203955

## 2017-11-22 ENCOUNTER — Ambulatory Visit: Payer: Medicare Other | Admitting: Neurology

## 2017-11-22 ENCOUNTER — Encounter: Payer: Self-pay | Admitting: Neurology

## 2017-11-22 VITALS — BP 165/80 | HR 53 | Ht 68.0 in | Wt 186.6 lb

## 2017-11-22 DIAGNOSIS — I639 Cerebral infarction, unspecified: Secondary | ICD-10-CM | POA: Diagnosis not present

## 2017-11-22 NOTE — Progress Notes (Signed)
[facility] [address] [place]. [place] [address] [phone]       OFFICE FOLLOW-UP NOTE  Zachary Calhoun Date of Birth:  01/07/1941 Medical Record Number:  [id number]   HPI: Zachary Calhoun is a 76 year old Caucasian male seen today for the first office follow-up visit following hospital admission for vision loss and stroke in October 2018.  History is obtained from the patient and review of electronic medical records.  I have personally reviewed imaging films.Zachary Calhoun is an 76 y.o. male with past medical history of hyperlipidemia and hypertension.  Patient states that last night he was watching TV when suddenly around 8:30 PM he noted that he could not see out of his left eye especially in the medial lower aspect.  Patient did go to his eye doctor who did a dilated exam and noted that there were some arterial occlusions thus sent him to the emergency department.  Currently patient has no other symptoms other than decreased to no vision in the lower medial aspect of his left eye.  For this reason he will need to be admitted and have a stroke workup. Date last known well: Date: 10/04/2017 Time last known well: Time: 20:30 tPA Given: No: Out of the window for TPA  NIHSSstroke scale of 1 Modified Rankin: Rankin Score=0.  MRI scan of the brain showed 2 tiny punctate diffusion positive infarcts and left insula and mid temporal cortex.  CT angiogram of the brain and neck revealed no significant extracranial or intracranial stenosis.  Transthoracic echo showed normal ejection fraction.  Transesophageal echo showed no cardiac source for embolism or PFO.  Hemoglobin A1c was 5.9 and LDL cholesterol was 28 mg percent.  Patient was started on Plavix for stroke prevention and continued on his cholesterol and blood pressure medicines.  He underwent loop recorder insertion and so far paroxysmal atrial fibrillation has not yet been detected.  Patient states he continues to have some partial  vision loss in the left eye in the superior medial quadrant.  Is tolerating Plavix well without bleeding but does get easy bruising.  Is tolerating Lipitor without muscle aches and pains.  His blood pressure is usually better controlled at home but today it is slightly elevated in the office at 165/80.  He has a follow-up appointment with his ophthalmologist in [place] soon.  He has no new complaints.    ROS:   14 system review of systems is positive for loss of vision, easy bruising, all other systems negative PMH:  Past Medical History:  Diagnosis Date  . History of gout X 1   "left big toe"  . History of kidney stones    "no OR" (10/05/2017)  . HLD (hyperlipidemia)   . Hypertension   . Stroke [facility])     Social History:  Social History   Socioeconomic History  . Marital status: Married    Spouse name: Not on file  . Number of children: Not on file  . Years of education: Not on file  . Highest education level: Not on file  Social Needs  . Financial resource strain: Not on file  . Food insecurity - worry: Not on file  . Food insecurity - inability: Not on file  . Transportation needs - medical: Not on file  . Transportation needs - non-medical: Not on file  Occupational History  . Not on file  Tobacco Use  . Smoking status: Never Smoker  . Smokeless tobacco: Never Used  Substance and Sexual  Activity  . Alcohol use: No  . Drug use: No  . Sexual activity: Yes  Other Topics Concern  . Not on file  Social History Narrative  . Not on file    Medications:   Current Outpatient Medications on File Prior to Visit  Medication Sig Dispense Refill  . atorvastatin (LIPITOR) 40 MG tablet Take 40 mg by mouth daily.    . captopril (CAPOTEN) 50 MG tablet 50 mg 2 (two) times daily.     . clopidogrel (PLAVIX) 75 MG tablet Take 1 tablet (75 mg total) by mouth daily. 30 tablet 0  . Multiple Vitamin (MULTIVITAMIN) tablet Take 1 tablet daily by mouth.    . niacin 500 MG tablet Take  500 mg daily by mouth.    . Omega-3 Fatty Acids (FISH OIL) 1000 MG CAPS Take 1,000 mg by mouth daily.     No current facility-administered medications on file prior to visit.     Allergies:  No Known Allergies  Physical Exam General: well developed, well nourished elderly Caucasian male, seated, in no evident distress Head: head normocephalic and atraumatic.  Neck: supple with no carotid or supraclavicular bruits Cardiovascular: regular rate and rhythm, no murmurs Musculoskeletal: no deformity Skin:  no rash/petichiae Vascular:  Normal pulses all extremities Vitals:   11/22/17 0850  BP: (!) 165/80  Pulse: (!) 53   Neurologic Exam Mental Status: Awake and fully alert. Oriented to place and time. Recent and remote memory intact. Attention span, concentration and fund of knowledge appropriate. Mood and affect appropriate.  Cranial Nerves: Fundoscopic exam reveals sharp disc margins. Pupils equal, briskly reactive to light. Extraocular movements full without nystagmus. Visual fields show partial loss in upper nasal quadrant of left eye. Hearing intact. Facial sensation intact. Face, tongue, palate moves normally and symmetrically.  Motor: Normal bulk and tone. Normal strength in all tested extremity muscles. Sensory.: intact to touch ,pinprick .position and vibratory sensation.  Coordination: Rapid alternating movements normal in all extremities. Finger-to-nose and heel-to-shin performed accurately bilaterally. Gait and Station: Arises from chair without difficulty. Stance is normal. Gait demonstrates normal stride length and balance . Able to heel, toe and tandem walk without difficulty.  Reflexes: 1+ and symmetric. Toes downgoing.   NIHSS 1 Modified Rankin  2   ASSESSMENT: 76 year old Caucasian male with left eye partial vision loss due to retinal artery branch occlusion and tiny punctate left temporal embolic infarcts of cryptogenic etiology in October 2018.  Vascular risk factors  of hyperlipidemia and hypertension only.    PLAN: I had a long d/w patient about his recent cryptogenic stroke, risk for recurrent stroke/TIAs, personally independently reviewed imaging studies and stroke evaluation results and answered questions.Continue Plavix  for secondary stroke prevention and maintain strict control of hypertension with blood pressure goal below 130/90, diabetes with hemoglobin A1c goal below 6.5% and lipids with LDL cholesterol goal below 70 mg/dL. I also advised the patient to eat a healthy diet with plenty of whole grains, cereals, fruits and vegetables, exercise regularly and maintain ideal body weight Followup in the future with my nurse practitioner Janett Billow in 6 months or call earlier if necessary.  Greater than 50% of time during this 25 minute visit was spent on counseling,explanation of diagnosis of cryptogenic stroke, planning of further management, discussion with patient and family and coordination of care Antony Contras, MD  [facility] [address] [place], [place] [address]  Phone [phone] Fax [phone] Note: This document was prepared with digital dictation and  possible smart Company secretary. Any transcriptional errors that result from this process are unintentional

## 2017-11-22 NOTE — Patient Instructions (Addendum)
I had a long d/w patient about his recent cryptogenic stroke, risk for recurrent stroke/TIAs, personally independently reviewed imaging studies and stroke evaluation results and answered questions.Continue Plavix  for secondary stroke prevention and maintain strict control of hypertension with blood pressure goal below 130/90, diabetes with hemoglobin A1c goal below 6.5% and lipids with LDL cholesterol goal below 70 mg/dL. I also advised the patient to eat a healthy diet with plenty of whole grains, cereals, fruits and vegetables, exercise regularly and maintain ideal body weight Followup in the future with my nurse practitioner Janett Billow in 6 months or call earlier if necessary.  Stroke Prevention Some medical conditions and behaviors are associated with an increased chance of having a stroke. You may prevent a stroke by making healthy choices and managing medical conditions. How can I reduce my risk of having a stroke?  Stay physically active. Get at least 30 minutes of activity on most or all days.  Do not smoke. It may also be helpful to avoid exposure to secondhand smoke.  Limit alcohol use. Moderate alcohol use is considered to be: ? No more than 2 drinks per day for men. ? No more than 1 drink per day for nonpregnant women.  Eat healthy foods. This involves: ? Eating 5 or more servings of fruits and vegetables a day. ? Making dietary changes that address high blood pressure (hypertension), high cholesterol, diabetes, or obesity.  Manage your cholesterol levels. ? Making food choices that are high in fiber and low in saturated fat, trans fat, and cholesterol may control cholesterol levels. ? Take any prescribed medicines to control cholesterol as directed by your health care provider.  Manage your diabetes. ? Controlling your carbohydrate and sugar intake is recommended to manage diabetes. ? Take any prescribed medicines to control diabetes as directed by your health care  provider.  Control your hypertension. ? Making food choices that are low in salt (sodium), saturated fat, trans fat, and cholesterol is recommended to manage hypertension. ? Ask your health care provider if you need treatment to lower your blood pressure. Take any prescribed medicines to control hypertension as directed by your health care provider. ? If you are 55-72 years of age, have your blood pressure checked every 3-5 years. If you are 64 years of age or older, have your blood pressure checked every year.  Maintain a healthy weight. ? Reducing calorie intake and making food choices that are low in sodium, saturated fat, trans fat, and cholesterol are recommended to manage weight.  Stop drug abuse.  Avoid taking birth control pills. ? Talk to your health care provider about the risks of taking birth control pills if you are over 28 years old, smoke, get migraines, or have ever had a blood clot.  Get evaluated for sleep disorders (sleep apnea). ? Talk to your health care provider about getting a sleep evaluation if you snore a lot or have excessive sleepiness.  Take medicines only as directed by your health care provider. ? For some people, aspirin or blood thinners (anticoagulants) are helpful in reducing the risk of forming abnormal blood clots that can lead to stroke. If you have the irregular heart rhythm of atrial fibrillation, you should be on a blood thinner unless there is a good reason you cannot take them. ? Understand all your medicine instructions.  Make sure that other conditions (such as anemia or atherosclerosis) are addressed. Get help right away if:  You have sudden weakness or numbness of the face, arm, or  leg, especially on one side of the body.  Your face or eyelid droops to one side.  You have sudden confusion.  You have trouble speaking (aphasia) or understanding.  You have sudden trouble seeing in one or both eyes.  You have sudden trouble walking.  You  have dizziness.  You have a loss of balance or coordination.  You have a sudden, severe headache with no known cause.  You have new chest pain or an irregular heartbeat. Any of these symptoms may represent a serious problem that is an emergency. Do not wait to see if the symptoms will go away. Get medical help at once. Call your local emergency services (911 in [place] Do not drive yourself to the hospital. This information is not intended to replace advice given to you by your health care provider. Make sure you discuss any questions you have with your health care provider. Document Released: 12/30/2004 Document Revised: 04/29/2016 Document Reviewed: 05/25/2013 Elsevier Interactive Patient Education  2017 [employer].

## 2017-12-07 ENCOUNTER — Ambulatory Visit (INDEPENDENT_AMBULATORY_CARE_PROVIDER_SITE_OTHER): Payer: Medicare Other | Admitting: *Deleted

## 2017-12-07 DIAGNOSIS — I639 Cerebral infarction, unspecified: Secondary | ICD-10-CM

## 2017-12-08 DIAGNOSIS — E119 Type 2 diabetes mellitus without complications: Secondary | ICD-10-CM | POA: Diagnosis not present

## 2017-12-08 DIAGNOSIS — Z79899 Other long term (current) drug therapy: Secondary | ICD-10-CM | POA: Diagnosis not present

## 2017-12-08 DIAGNOSIS — Z0000 Encounter for general adult medical examination without abnormal findings: Secondary | ICD-10-CM | POA: Diagnosis not present

## 2017-12-08 DIAGNOSIS — I10 Essential (primary) hypertension: Secondary | ICD-10-CM | POA: Diagnosis not present

## 2017-12-08 DIAGNOSIS — E782 Mixed hyperlipidemia: Secondary | ICD-10-CM | POA: Diagnosis not present

## 2017-12-08 NOTE — Progress Notes (Signed)
[facility] Summary Report / Loop Recorder 

## 2017-12-20 LAB — CUP PACEART REMOTE DEVICE CHECK
Date Time Interrogation Session: 20190101204032
MDC IDC PG IMPLANT DT: 20181102

## 2018-01-05 ENCOUNTER — Ambulatory Visit (INDEPENDENT_AMBULATORY_CARE_PROVIDER_SITE_OTHER): Payer: Medicare Other | Admitting: *Deleted

## 2018-01-05 DIAGNOSIS — I639 Cerebral infarction, unspecified: Secondary | ICD-10-CM | POA: Diagnosis not present

## 2018-01-06 NOTE — Progress Notes (Signed)
[facility] Summary Report / Loop Recorder 

## 2018-01-17 LAB — CUP PACEART REMOTE DEVICE CHECK
Date Time Interrogation Session: 20190131204103
Implantable Pulse Generator Implant Date: 20181102

## 2018-01-19 DIAGNOSIS — S0500XA Injury of conjunctiva and corneal abrasion without foreign body, unspecified eye, initial encounter: Secondary | ICD-10-CM | POA: Diagnosis not present

## 2018-01-19 DIAGNOSIS — S0501XA Injury of conjunctiva and corneal abrasion without foreign body, right eye, initial encounter: Secondary | ICD-10-CM | POA: Diagnosis not present

## 2018-02-07 ENCOUNTER — Ambulatory Visit (INDEPENDENT_AMBULATORY_CARE_PROVIDER_SITE_OTHER): Payer: Medicare Other | Admitting: *Deleted

## 2018-02-07 DIAGNOSIS — I639 Cerebral infarction, unspecified: Secondary | ICD-10-CM

## 2018-02-08 NOTE — Progress Notes (Signed)
[facility] Summary Report / Loop Recorder 

## 2018-02-17 DIAGNOSIS — H2513 Age-related nuclear cataract, bilateral: Secondary | ICD-10-CM | POA: Diagnosis not present

## 2018-02-17 DIAGNOSIS — H53412 Scotoma involving central area, left eye: Secondary | ICD-10-CM | POA: Diagnosis not present

## 2018-02-17 DIAGNOSIS — H34212 Partial retinal artery occlusion, left eye: Secondary | ICD-10-CM | POA: Diagnosis not present

## 2018-03-13 ENCOUNTER — Ambulatory Visit (INDEPENDENT_AMBULATORY_CARE_PROVIDER_SITE_OTHER): Payer: Medicare Other | Admitting: *Deleted

## 2018-03-13 DIAGNOSIS — I639 Cerebral infarction, unspecified: Secondary | ICD-10-CM | POA: Diagnosis not present

## 2018-03-14 NOTE — Progress Notes (Signed)
[facility] Summary Report / Loop Recorder 

## 2018-03-21 DIAGNOSIS — H02814 Retained foreign body in left upper eyelid: Secondary | ICD-10-CM | POA: Diagnosis not present

## 2018-03-21 LAB — CUP PACEART REMOTE DEVICE CHECK
Implantable Pulse Generator Implant Date: 20181102
MDC IDC SESS DTM: 20190305213724

## 2018-04-14 ENCOUNTER — Ambulatory Visit (INDEPENDENT_AMBULATORY_CARE_PROVIDER_SITE_OTHER): Payer: Medicare Other | Admitting: *Deleted

## 2018-04-14 DIAGNOSIS — I639 Cerebral infarction, unspecified: Secondary | ICD-10-CM

## 2018-04-14 LAB — CUP PACEART REMOTE DEVICE CHECK
Date Time Interrogation Session: 20190407224106
MDC IDC PG IMPLANT DT: 20181102

## 2018-04-17 NOTE — Progress Notes (Signed)
[facility] Summary Report / Loop Recorder 

## 2018-05-09 LAB — CUP PACEART REMOTE DEVICE CHECK
Date Time Interrogation Session: 20190510233750
MDC IDC PG IMPLANT DT: 20181102

## 2018-05-17 ENCOUNTER — Ambulatory Visit (INDEPENDENT_AMBULATORY_CARE_PROVIDER_SITE_OTHER): Payer: Medicare Other | Admitting: *Deleted

## 2018-05-17 DIAGNOSIS — I639 Cerebral infarction, unspecified: Secondary | ICD-10-CM | POA: Diagnosis not present

## 2018-05-18 NOTE — Progress Notes (Signed)
[facility] Summary Report / Loop Recorder 

## 2018-05-23 ENCOUNTER — Ambulatory Visit: Payer: Medicare Other | Admitting: Gastroenterology

## 2018-05-23 ENCOUNTER — Ambulatory Visit: Payer: Medicare Other | Admitting: Adult Health

## 2018-05-23 ENCOUNTER — Encounter: Payer: Self-pay | Admitting: Adult Health

## 2018-05-23 ENCOUNTER — Encounter: Payer: Self-pay | Admitting: Gastroenterology

## 2018-05-23 ENCOUNTER — Telehealth: Payer: Self-pay

## 2018-05-23 VITALS — BP 143/73 | HR 49 | Ht 68.0 in | Wt 184.0 lb

## 2018-05-23 VITALS — BP 140/88 | HR 72 | Ht 67.0 in | Wt 183.1 lb

## 2018-05-23 DIAGNOSIS — K5900 Constipation, unspecified: Secondary | ICD-10-CM | POA: Diagnosis not present

## 2018-05-23 DIAGNOSIS — E785 Hyperlipidemia, unspecified: Secondary | ICD-10-CM

## 2018-05-23 DIAGNOSIS — Z7902 Long term (current) use of antithrombotics/antiplatelets: Secondary | ICD-10-CM

## 2018-05-23 DIAGNOSIS — I639 Cerebral infarction, unspecified: Secondary | ICD-10-CM

## 2018-05-23 DIAGNOSIS — R194 Change in bowel habit: Secondary | ICD-10-CM | POA: Diagnosis not present

## 2018-05-23 DIAGNOSIS — I10 Essential (primary) hypertension: Secondary | ICD-10-CM | POA: Diagnosis not present

## 2018-05-23 MED ORDER — SUPREP BOWEL PREP KIT 17.5-3.13-1.6 GM/177ML PO SOLN: ORAL | 0 refills | Status: DC

## 2018-05-23 NOTE — Telephone Encounter (Signed)
[facility] Pre-operative Risk Assessment     Request for surgical clearance:     Endoscopy Procedure  What type of surgery is being performed?     colonoscopy  When is this surgery scheduled?     06-07-18  What type of clearance is required ?   Pharmacy  Are there any medications that need to be held prior to surgery and how long? Plavix 5 days  Practice name and name of physician performing surgery?      [facility]  What is your office phone and fax number?      Phone- [phone]  [phone]  Anesthesia type (None, local, MAC, general) ?       MAC

## 2018-05-23 NOTE — Telephone Encounter (Signed)
This patient is not followed in our office. Apparently he is on Plavix for stroke. The prescribing provider needs to be notified of this request- likely neurology.   For pre-op callback pool: Please inform the requesting provider of this.

## 2018-05-23 NOTE — Patient Instructions (Signed)
Continue clopidogrel 75 mg daily  and lipitor  for secondary stroke prevention  Continue to follow up with PCP regarding cholesterol and blood pressure management   Continue to monitor loop recorder - you will be notified if we find atrial fibrillation  Continue to follow up with eye doctor for continued slight vision loss  Continue to monitor blood pressure at home  Maintain strict control of hypertension with blood pressure goal below 130/90, diabetes with hemoglobin A1c goal below 6.5% and cholesterol with LDL cholesterol (bad cholesterol) goal below 70 mg/dL. I also advised the patient to eat a healthy diet with plenty of whole grains, cereals, fruits and vegetables, exercise regularly and maintain ideal body weight.  Followup in the future with me as needed or call earlier if needed       Thank you for coming to see [place] at [facility]. I hope we have been able to provide you high quality care today.  You may receive a patient satisfaction survey over the next few weeks. We would appreciate your feedback and comments so that we may continue to improve ourselves and the health of our patients.

## 2018-05-23 NOTE — Patient Instructions (Addendum)
If you are age 78 or older, your body mass index should be between 23-30. Your Body mass index is 28.68 kg/m. If this is out of the aforementioned range listed, please consider follow up with your Primary Care Provider.  If you are age 4 or younger, your body mass index should be between 19-25. Your Body mass index is 28.68 kg/m. If this is out of the aformentioned range listed, please consider follow up with your Primary Care Provider.   You have been scheduled for a colonoscopy. Please follow written instructions given to you at your visit today.  Please pick up your prep supplies at the pharmacy within the next 1-3 days. If you use inhalers (even only as needed), please bring them with you on the day of your procedure. Your physician has requested that you go to www.startemmi.com and enter the access code given to you at your visit today. This web site gives a general overview about your procedure. However, you should still follow specific instructions given to you by our office regarding your preparation for the procedure.  You will be contacted by our office prior to your procedure for directions on holding your Plavix.  If you do not hear from our office 1 week prior to your scheduled procedure, please call [phone] to discuss.   Today you should use 2 Fleet enemas.  Please purchase the following medications over the counter and take as directed: Miralax: Use twice a day as needed.  Goal is to achieve one bowel movement daily.  You can use dulcolax tablets as discussed today as needed.  Thank you for entrusting me with your care and for choosing [facility], Dr. [place] Cellar

## 2018-05-23 NOTE — Progress Notes (Signed)
[facility] [address] [place]. [place] [address] [phone]       OFFICE FOLLOW-UP NOTE  Zachary Calhoun Date of Birth:  1941-11-29 Medical Record Number:  [id number]   HPI: Zachary Calhoun is a 77 year old Caucasian male seen today for the first office follow-up visit following hospital admission for vision loss and stroke in October 2018.  History is obtained from the patient and review of electronic medical records.  I have personally reviewed imaging films.Zachary Calhoun is an 77 y.o. male with past medical history of hyperlipidemia and hypertension.  Patient states that last night he was watching TV when suddenly around 8:30 PM he noted that he could not see out of his left eye especially in the medial lower aspect.  Patient did go to his eye doctor who did a dilated exam and noted that there were some arterial occlusions thus sent him to the emergency department.  Currently patient has no other symptoms other than decreased to no vision in the lower medial aspect of his left eye.  For this reason he will need to be admitted and have a stroke workup. Date last known well: Date: 10/04/2017 Time last known well: Time: 20:30 tPA Given: No: Out of the window for TPA  NIHSSstroke scale of 1 Modified Rankin: Rankin Score=0.  MRI scan of the brain showed 2 tiny punctate diffusion positive infarcts and left insula and mid temporal cortex.  CT angiogram of the brain and neck revealed no significant extracranial or intracranial stenosis.  Transthoracic echo showed normal ejection fraction.  Transesophageal echo showed no cardiac source for embolism or PFO.  Hemoglobin A1c was 5.9 and LDL cholesterol was 28 mg percent.  Patient was started on Plavix for stroke prevention and continued on his cholesterol and blood pressure medicines.  He underwent loop recorder insertion and so far paroxysmal atrial fibrillation has not yet been detected.  Patient states he continues to have some partial  vision loss in the left eye in the superior medial quadrant.  Is tolerating Plavix well without bleeding but does get easy bruising.  Is tolerating Lipitor without muscle aches and pains.  His blood pressure is usually better controlled at home but today it is slightly elevated in the office at 165/80.  He has a follow-up appointment with his ophthalmologist in [place] soon.  He has no new complaints.  05/23/18 UPDATE: Patient returns today for 72-month follow-up.  Overall he is doing well but does have an mild residual vision loss.  He states he feels as though it is located in the inside corner of left eye.  He does follow-up with ophthalmologist who stated he is getting full perfusion per patient but still is experiencing vision loss.  He has had some improvement.  He will be returning to ophthalmologist October 1.  He continues to take Plavix with side effects of mild bruising but no bleeding.  Continues to take Lipitor without side effects myalgias.  Blood pressure today satisfactory 143/73.  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  He does continue to follow-up with PCP Dr. Venetia Maxon for management of blood pressure and cholesterol.  Loop recorder has not shown atrial fibrillation thus far.  Denies new or worsening stroke/TIA symptoms.   ROS:   14 system review of systems is positive for loss of vision, ringing in ears and joint pain and all other systems negative PMH:  Past Medical History:  Diagnosis Date  . History of gout X 1   "left big toe"  . History of kidney stones    "no OR" (10/05/2017)  . HLD (hyperlipidemia)   . Hypertension   . Stroke [facility])     Social History:  Social History   Socioeconomic History  . Marital status: Married    Spouse name: Not on file  . Number of children: Not on file  . Years of education: Not on file  . Highest education level: Not on file  Occupational History  . Not on file  Social  Needs  . Financial resource strain: Not on file  . Food insecurity:    Worry: Not on file    Inability: Not on file  . Transportation needs:    Medical: Not on file    Non-medical: Not on file  Tobacco Use  . Smoking status: Never Smoker  . Smokeless tobacco: Never Used  Substance and Sexual Activity  . Alcohol use: No  . Drug use: No  . Sexual activity: Yes  Lifestyle  . Physical activity:    Days per week: Not on file    Minutes per session: Not on file  . Stress: Not on file  Relationships  . Social connections:    Talks on phone: Not on file    Gets together: Not on file    Attends religious service: Not on file    Active member of club or organization: Not on file    Attends meetings of clubs or organizations: Not on file    Relationship status: Not on file  . Intimate partner violence:    Fear of current or ex partner: Not on file    Emotionally abused: Not on file    Physically abused: Not on file    Forced sexual activity: Not on file  Other Topics Concern  . Not on file  Social History Narrative  . Not on file    Medications:   Current Outpatient Medications on File Prior to Visit  Medication Sig Dispense Refill  . atorvastatin (LIPITOR) 40 MG tablet Take 40 mg by mouth daily.    . captopril (CAPOTEN) 50 MG tablet 50 mg 2 (two) times daily.     . clopidogrel (PLAVIX) 75 MG tablet Take 1 tablet (75 mg total) by mouth daily. 30 tablet 0  . Multiple Vitamin (MULTIVITAMIN) tablet Take 1 tablet daily by mouth.    . niacin 500 MG tablet Take 500 mg daily by mouth.    . Omega-3 Fatty Acids (FISH OIL) 1000 MG CAPS Take 1,000 mg by mouth daily.     No current facility-administered medications on file prior to visit.     Allergies:  No Known Allergies  Physical Exam General: well developed, well nourished pleasant elderly Caucasian male, seated, in no evident distress Head: head normocephalic and atraumatic.  Neck: supple with no carotid or supraclavicular  bruits Cardiovascular: regular rate and rhythm, no murmurs Musculoskeletal: no deformity Skin:  no rash/petichiae Vascular:  Normal pulses all extremities Vitals:   05/23/18 0713  BP: (!) 143/73  Pulse: (!) 49   Neurologic Exam Mental Status: Awake and fully alert. Oriented to place and time. Recent and remote memory intact. Attention span, concentration and fund of knowledge appropriate. Mood and affect appropriate.  Cranial Nerves: Fundoscopic exam reveals sharp disc margins. Pupils equal, briskly reactive to light. Extraocular movements full without nystagmus. Visual fields show partial loss in upper nasal quadrant of left eye. Hearing intact.  Facial sensation intact. Face, tongue, palate moves normally and symmetrically.  Motor: Normal bulk and tone. Normal strength in all tested extremity muscles. Sensory.: intact to touch ,pinprick .position and vibratory sensation.  Coordination: Rapid alternating movements normal in all extremities. Finger-to-nose and heel-to-shin performed accurately bilaterally. Gait and Station: Arises from chair without difficulty. Stance is normal. Gait demonstrates normal stride length and balance . Able to heel, toe and tandem walk without difficulty.  Reflexes: 1+ and symmetric. Toes downgoing.      ASSESSMENT: 77 year old Caucasian male with left eye partial vision loss due to retinal artery branch occlusion and tiny punctate left temporal embolic infarcts of cryptogenic etiology in October 2018.  Vascular risk factors of hyperlipidemia and hypertension only.  Returns today for 41-month follow-up visit.    PLAN: -Continue clopidogrel 75 mg daily  and Lipitor for secondary stroke prevention -F/u with PCP regarding your HLD and HTN management -Continue to monitor loop recorder -f/u with ophthalmologist on 09/05/2018 -continue to monitor BP at home -Maintain strict control of hypertension with blood pressure goal below 130/90, diabetes with hemoglobin A1c  goal below 6.5% and cholesterol with LDL cholesterol (bad cholesterol) goal below 70 mg/dL. I also advised the patient to eat a healthy diet with plenty of whole grains, cereals, fruits and vegetables, exercise regularly and maintain ideal body weight.  Follow up as needed or call earlier if needed  Greater than 50% of time during this 25 minute visit was spent on counseling,explanation of diagnosis of left temporal embolic infarcts, reviewing risk factor management of HLD and HTN, planning of further management, discussion with patient and family and coordination of care  Venancio Poisson, [facility]  [facility] [address] [place],  [address]  Phone [phone] Fax [phone]

## 2018-05-23 NOTE — Progress Notes (Signed)
HPI :  77 year old male with a history of CVA on Plavix, hypertension, hyperlipidemia, referred by Cyndy Freeze for changes in bowel habits.  The patient states about 4 weeks ago he had a dramatic change in his bowel habits.  He is gone several days without a bowel movement, endorses straining.  He is only had a few bowel movements over the past few weeks, only in the setting of using laxatives such as Dulcolax.  He states the stool he does produce is thinner in caliber than usual. He denies any blood in his stools.  He does have some lower abdominal discomfort during this time which can be relieved with bowel movement.  He is eating well, no nausea no vomiting, no postprandial pain.  His last colonoscopy was done in 2015 at which point time he had an 8 mm adenoma removed.  Review of the chart shows he had a fair prep in the right colon and the right colon was incompletely visualized.  He denies any family history of colon cancer.  He otherwise denies any cardiopulmonary symptoms at this time.   Echo 10/06/2017 - EF 55-60%  Colonoscopy 01/14/2014 - fair prep in right colon, could not visualize completely, 75mm transverse adenoma, diverticulosis,   Past Medical History:  Diagnosis Date  . History of gout X 1   "left big toe"  . History of kidney stones    "no OR" (10/05/2017)  . HLD (hyperlipidemia)   . Hypertension   . Stroke [facility])      Past Surgical History:  Procedure Laterality Date  . APPENDECTOMY    . INGUINAL HERNIA REPAIR Bilateral   . LOOP RECORDER INSERTION N/A 10/07/2017   Procedure: LOOP RECORDER INSERTION;  Surgeon: Constance Haw, MD;  Location: [facility] LAB;  Service: Cardiovascular;  Laterality: N/A;  . TEE WITHOUT CARDIOVERSION N/A 10/07/2017   Procedure: TRANSESOPHAGEAL ECHOCARDIOGRAM (TEE);  Surgeon: Dorothy Spark, MD;  Location: [facility] ENDOSCOPY;  Service: Cardiovascular;  Laterality: N/A;   Family History  Problem Relation Age of Onset  . Stroke  Mother    Social History   Tobacco Use  . Smoking status: Never Smoker  . Smokeless tobacco: Never Used  Substance Use Topics  . Alcohol use: No  . Drug use: No   Current Outpatient Medications  Medication Sig Dispense Refill  . atorvastatin (LIPITOR) 40 MG tablet Take 40 mg by mouth daily.    . captopril (CAPOTEN) 50 MG tablet 50 mg 2 (two) times daily.     . clopidogrel (PLAVIX) 75 MG tablet Take 1 tablet (75 mg total) by mouth daily. 30 tablet 0  . Multiple Vitamin (MULTIVITAMIN) tablet Take 1 tablet daily by mouth.    . niacin 500 MG tablet Take 500 mg daily by mouth.    . Omega-3 Fatty Acids (FISH OIL) 1000 MG CAPS Take 1,000 mg by mouth daily.     No current facility-administered medications for this visit.    No Known Allergies   Review of Systems: All systems reviewed and negative except where noted in HPI.   Lab Results  Component Value Date   WBC 5.6 10/05/2017   HGB 14.4 10/05/2017   HCT 43.1 10/05/2017   MCV 96.2 10/05/2017   PLT 193 10/05/2017    Lab Results  Component Value Date   CREATININE 1.37 (H) 10/07/2017   BUN 17 10/07/2017   NA 138 10/07/2017   K 4.7 10/07/2017   CL 104 10/07/2017   CO2 27 10/07/2017  Lab Results  Component Value Date   ALT 32 10/05/2017   AST 36 10/05/2017   ALKPHOS 77 10/05/2017   BILITOT 0.7 10/05/2017     Physical Exam: BP 140/88   Pulse 72   Ht 5\' 7"  (1.702 m)   Wt 183 lb 2 oz (83.1 kg)   BMI 28.68 kg/m  Constitutional: Pleasant,well-developed, male in no acute distress. HEENT: Normocephalic and atraumatic. Conjunctivae are normal. No scleral icterus. Neck supple.  Cardiovascular: Normal rate, regular rhythm.  Pulmonary/chest: Effort normal and breath sounds normal. No wheezing, rales or rhonchi. Abdominal: Soft, nondistended, nontender. . There are no masses palpable. No hepatomegaly. DRE - normal - no impacted stool or mass lesion appreciated Extremities: no edema Lymphadenopathy: No cervical  adenopathy noted. Neurological: Alert and oriented to person place and time. Skin: Skin is warm and dry. No rashes noted. Psychiatric: Normal mood and affect. Behavior is normal.   ASSESSMENT AND PLAN: 77 year old male with history as outlined above, on Plavix for history of CVA, referred for dramatic change in bowel habits/recently developed constipation.  Last colonoscopy as outlined above.  We discussed options for evaluation.  Following discussion of risks and benefits of colonoscopy and anesthesia, he wanted to proceed with a colonoscopy given this change in his symptoms and his last colonoscopy was limited by fair preparation.  We will seek approval for him to hold Plavix for 5 days prior to the procedure, although he can take aspirin during that time.  In interim for management of his constipation, recommend he try Fleet enema x2 at home today to see if this can stimulate his bowel, and start taking MiraLAX twice daily, titrate up as needed for goal 1 bowel movement every other day.  He can also continue Dulcolax as needed.  If for some reason he develops abdominal pain vomiting/obstructive symptoms during this course, or this regimen fails to help him, he needs to contact me. He agrees with the plan.  Berlin Cellar, MD [facility]  CC: Cyndy Freeze, MD

## 2018-05-24 ENCOUNTER — Encounter: Payer: Self-pay | Admitting: Gastroenterology

## 2018-05-24 ENCOUNTER — Telehealth: Payer: Self-pay

## 2018-05-24 NOTE — Telephone Encounter (Signed)
Patient had a cryptogenic stroke in October 2018. He has been on plavix since this time. He has been doing well from a stroke standpoint with minor residual symptoms but no recurrent stroke/TIA symptoms. At this time, he can stop plavix 5 days prior to procedure but patient needs to be aware of small stroke risk while stopping plavix for that time. He can restart plavix immediately after procedure, once hemodynamically stable. Any further questions, please let me know. Thank you.

## 2018-05-24 NOTE — Telephone Encounter (Signed)
Error

## 2018-05-24 NOTE — Telephone Encounter (Signed)
I have notified the requesting office that we are not in charge of pt's Plavix and they should contact neurology to see if they are the ones prescribing it.

## 2018-05-24 NOTE — Telephone Encounter (Signed)
Called pt. Relayed clearance to hold Plavix starting on June 28th for July 3rd procedure.  Will take aspirin on those days.  He expressed understanding.

## 2018-05-24 NOTE — Telephone Encounter (Signed)
   DIMITRI SHAKESPEARE Jun 13, 1941 [id number]  Dear Venancio Poisson, NP:  We have scheduled the above named patient for a(n) colonoscopy procedure. Our records show that he is on anticoagulation therapy.  Please advise as to whether the patient may come off their therapy of Plavix 5 days prior to their procedure which is scheduled for 06-07-18.  Please route your response to Lemar Lofty, CMA or fax response to [phone].  Sincerely,    [facility]

## 2018-05-24 NOTE — Progress Notes (Signed)
I agree with the above plan 

## 2018-06-07 ENCOUNTER — Ambulatory Visit (AMBULATORY_SURGERY_CENTER): Payer: Medicare Other | Admitting: Gastroenterology

## 2018-06-07 ENCOUNTER — Encounter: Payer: Self-pay | Admitting: Gastroenterology

## 2018-06-07 VITALS — BP 96/58 | HR 60 | Temp 98.2°F | Resp 17 | Ht 67.0 in | Wt 183.0 lb

## 2018-06-07 DIAGNOSIS — K635 Polyp of colon: Secondary | ICD-10-CM

## 2018-06-07 DIAGNOSIS — R194 Change in bowel habit: Secondary | ICD-10-CM | POA: Diagnosis present

## 2018-06-07 DIAGNOSIS — D127 Benign neoplasm of rectosigmoid junction: Secondary | ICD-10-CM

## 2018-06-07 DIAGNOSIS — D120 Benign neoplasm of cecum: Secondary | ICD-10-CM

## 2018-06-07 DIAGNOSIS — D123 Benign neoplasm of transverse colon: Secondary | ICD-10-CM

## 2018-06-07 DIAGNOSIS — D124 Benign neoplasm of descending colon: Secondary | ICD-10-CM | POA: Diagnosis not present

## 2018-06-07 DIAGNOSIS — D122 Benign neoplasm of ascending colon: Secondary | ICD-10-CM

## 2018-06-07 MED ORDER — SODIUM CHLORIDE 0.9 % IV SOLN: 500.0000 mL | Freq: Once | INTRAVENOUS | Status: DC

## 2018-06-07 NOTE — Progress Notes (Signed)
Report to PACU, RN, vss, BBS= Clear.  

## 2018-06-07 NOTE — Op Note (Signed)
[facility] Patient Name: Zachary Calhoun Procedure Date: 06/07/2018 2:47 PM MRN: [id number] Endoscopist: Remo Lipps P. Havery Moros , MD Age: 77 Referring MD:  Date of Birth: Mar 10, 1941 Gender: Male Account #: [id number] Procedure:                Colonoscopy Indications:              Change in bowel habits, history of colon polyps -                            patient improved with Miralax Medicines:                Monitored Anesthesia Care Procedure:                Pre-Anesthesia Assessment:                           - Prior to the procedure, a History and Physical                            was performed, and patient medications and                            allergies were reviewed. The patient's tolerance of                            previous anesthesia was also reviewed. The risks                            and benefits of the procedure and the sedation                            options and risks were discussed with the patient.                            All questions were answered, and informed consent                            was obtained. Prior Anticoagulants: The patient has                            taken Plavix (clopidogrel), last dose was 5 days                            prior to procedure. ASA Grade Assessment: II - A                            patient with mild systemic disease. After reviewing                            the risks and benefits, the patient was deemed in                            satisfactory condition to undergo the procedure.  After obtaining informed consent, the colonoscope                            was passed under direct vision. Throughout the                            procedure, the patient's blood pressure, pulse, and                            oxygen saturations were monitored continuously. The                            Model PCF-[id number]) scope was introduced                            through the  anus and advanced to the the terminal                            ileum, with identification of the appendiceal                            orifice and IC valve. The colonoscopy was performed                            without difficulty. The patient tolerated the                            procedure well. The quality of the bowel                            preparation was adequate. The terminal ileum,                            ileocecal valve, appendiceal orifice, and rectum                            were photographed. Scope In: 2:56:05 PM Scope Out: 3:28:20 PM Scope Withdrawal Time: 0 hours 27 minutes 30 seconds  Total Procedure Duration: 0 hours 32 minutes 15 seconds  Findings:                 The perianal and digital rectal examinations were                            normal.                           The terminal ileum appeared normal.                           Two sessile polyps were found in the cecum. The                            polyps were 3 to 4 mm in size. These polyps were  removed with a cold snare. Resection and retrieval                            were complete.                           A 3 mm polyp was found in the ileocecal valve. The                            polyp was sessile. The polyp was removed with a                            cold snare. Resection and retrieval were complete.                           A 3 mm polyp was found in the ascending colon. The                            polyp was sessile. The polyp was removed with a                            cold snare. Resection and retrieval were complete.                           Three sessile polyps were found in the hepatic                            flexure. The polyps were 3 to 4 mm in size. These                            polyps were removed with a cold snare. Resection                            and retrieval were complete.                           Three sessile polyps were  found in the transverse                            colon. The polyps were 3 to 5 mm in size. These                            polyps were removed with a cold snare. Resection                            and retrieval were complete.                           A 4 mm polyp was found in the descending colon. The                            polyp was sessile. The polyp was removed with  a                            cold snare. Resection and retrieval were complete.                           A 6 mm polyp was found in the sigmoid colon. The                            polyp was sessile. The polyp was removed with a                            cold snare. Resection and retrieval were complete.                           Scattered medium-mouthed diverticula were found in                            the transverse colon and ascending colon.                           Internal hemorrhoids were found during retroflexion.                           The exam was otherwise without abnormality. Complications:            No immediate complications. Estimated blood loss:                            Minimal. Estimated Blood Loss:     Estimated blood loss was minimal. Impression:               - The examined portion of the ileum was normal.                           - Two 3 to 4 mm polyps in the cecum, removed with a                            cold snare. Resected and retrieved.                           - One 3 mm polyp at the ileocecal valve, removed                            with a cold snare. Resected and retrieved.                           - One 3 mm polyp in the ascending colon, removed                            with a cold snare. Resected and retrieved.                           - Three 3 to 4 mm polyps at the hepatic  flexure,                            removed with a cold snare. Resected and retrieved.                           - Three 3 to 5 mm polyps in the transverse colon,                             removed with a cold snare. Resected and retrieved.                           - One 4 mm polyp in the descending colon, removed                            with a cold snare. Resected and retrieved.                           - One 6 mm polyp in the sigmoid colon, removed with                            a cold snare. Resected and retrieved.                           - Diverticulosis in the transverse colon and in the                            ascending colon.                           - Internal hemorrhoids.                           - The examination was otherwise normal. Recommendation:           - Patient has a contact number available for                            emergencies. The signs and symptoms of potential                            delayed complications were discussed with the                            patient. Return to normal activities tomorrow.                            Written discharge instructions were provided to the                            patient.                           - Resume previous diet.                           -  Continue present medications.                           - Resume Plavix in 24 hours                           - Await pathology results. Remo Lipps P. Armbruster, MD 06/07/2018 3:37:46 PM This report has been signed electronically.

## 2018-06-07 NOTE — Patient Instructions (Signed)
RESUME PLAVIX IN 24 HOURS.  YOU HAD AN ENDOSCOPIC PROCEDURE TODAY AT [facility]:   Refer to the procedure report that was given to you for any specific questions about what was found during the examination.  If the procedure report does not answer your questions, please call your gastroenterologist to clarify.  If you requested that your care partner not be given the details of your procedure findings, then the procedure report has been included in a sealed envelope for you to review at your convenience later.  YOU SHOULD EXPECT: Some feelings of bloating in the abdomen. Passage of more gas than usual.  Walking can help get rid of the air that was put into your GI tract during the procedure and reduce the bloating. If you had a lower endoscopy (such as a colonoscopy or flexible sigmoidoscopy) you may notice spotting of blood in your stool or on the toilet paper. If you underwent a bowel prep for your procedure, you may not have a normal bowel movement for a few days.  Please Note:  You might notice some irritation and congestion in your nose or some drainage.  This is from the oxygen used during your procedure.  There is no need for concern and it should clear up in a day or so.  SYMPTOMS TO REPORT IMMEDIATELY:   Following lower endoscopy (colonoscopy or flexible sigmoidoscopy):  Excessive amounts of blood in the stool  Significant tenderness or worsening of abdominal pains  Swelling of the abdomen that is new, acute  Fever of 100F or higher   For urgent or emergent issues, a gastroenterologist can be reached at any hour by calling [phone].   DIET:  We do recommend a small meal at first, but then you may proceed to your regular diet.  Drink plenty of fluids but you should avoid alcoholic beverages for 24 hours.  ACTIVITY:  You should plan to take it easy for the rest of today and you should NOT DRIVE or use heavy machinery until tomorrow (because of the sedation  medicines used during the test).    FOLLOW UP: Our staff will call the number listed on your records the next business day following your procedure to check on you and address any questions or concerns that you may have regarding the information given to you following your procedure. If we do not reach you, we will leave a message.  However, if you are feeling well and you are not experiencing any problems, there is no need to return our call.  We will assume that you have returned to your regular daily activities without incident.  If any biopsies were taken you will be contacted by phone or by letter within the next 1-3 weeks.  Please call us at [phone] if you have not heard about the biopsies in 3 weeks.    SIGNATURES/CONFIDENTIALITY: You and/or your care partner have signed paperwork which will be entered into your electronic medical record.  These signatures attest to the fact that that the information above on your After Visit Summary has been reviewed and is understood.  Full responsibility of the confidentiality of this discharge information lies with you and/or your care-partner.

## 2018-06-07 NOTE — Progress Notes (Signed)
Called to room to assist during endoscopic procedure.  Patient ID and intended procedure confirmed with present staff. Received instructions for my participation in the procedure from the performing physician.  

## 2018-06-07 NOTE — Progress Notes (Signed)
Pt's states no medical or surgical changes since previsit or office visit. 

## 2018-06-12 ENCOUNTER — Telehealth: Payer: Self-pay

## 2018-06-12 ENCOUNTER — Telehealth: Payer: Self-pay | Admitting: *Deleted

## 2018-06-12 NOTE — Telephone Encounter (Signed)
  Follow up Call-  Call back number 06/07/2018  Post procedure Call Back phone  # [phone]  Permission to leave phone message Yes  Some recent data might be hidden    [facility]

## 2018-06-12 NOTE — Telephone Encounter (Signed)
Left message

## 2018-06-13 ENCOUNTER — Encounter: Payer: Self-pay | Admitting: Gastroenterology

## 2018-06-19 ENCOUNTER — Ambulatory Visit (INDEPENDENT_AMBULATORY_CARE_PROVIDER_SITE_OTHER): Payer: Medicare Other | Admitting: *Deleted

## 2018-06-19 DIAGNOSIS — I639 Cerebral infarction, unspecified: Secondary | ICD-10-CM | POA: Diagnosis not present

## 2018-06-20 NOTE — Progress Notes (Signed)
[facility] Summary Report / Loop Recorder 

## 2018-06-23 LAB — CUP PACEART REMOTE DEVICE CHECK
Date Time Interrogation Session: 20190612233638
Implantable Pulse Generator Implant Date: 20181102

## 2018-07-05 DIAGNOSIS — T63444A Toxic effect of venom of bees, undetermined, initial encounter: Secondary | ICD-10-CM | POA: Diagnosis not present

## 2018-07-24 ENCOUNTER — Ambulatory Visit (INDEPENDENT_AMBULATORY_CARE_PROVIDER_SITE_OTHER): Payer: Medicare Other | Admitting: *Deleted

## 2018-07-24 DIAGNOSIS — I639 Cerebral infarction, unspecified: Secondary | ICD-10-CM | POA: Diagnosis not present

## 2018-07-25 NOTE — Progress Notes (Signed)
[facility] Summary Report / Loop Recorder 

## 2018-08-07 LAB — CUP PACEART REMOTE DEVICE CHECK
MDC IDC PG IMPLANT DT: 20181102
MDC IDC SESS DTM: 20190716000800

## 2018-08-11 DIAGNOSIS — H11821 Conjunctivochalasis, right eye: Secondary | ICD-10-CM | POA: Diagnosis not present

## 2018-08-11 DIAGNOSIS — H16141 Punctate keratitis, right eye: Secondary | ICD-10-CM | POA: Diagnosis not present

## 2018-08-11 DIAGNOSIS — H02102 Unspecified ectropion of right lower eyelid: Secondary | ICD-10-CM | POA: Diagnosis not present

## 2018-08-22 DIAGNOSIS — H11821 Conjunctivochalasis, right eye: Secondary | ICD-10-CM | POA: Diagnosis not present

## 2018-08-22 DIAGNOSIS — H66009 Acute suppurative otitis media without spontaneous rupture of ear drum, unspecified ear: Secondary | ICD-10-CM | POA: Diagnosis not present

## 2018-08-24 ENCOUNTER — Ambulatory Visit (INDEPENDENT_AMBULATORY_CARE_PROVIDER_SITE_OTHER): Payer: Medicare Other | Admitting: *Deleted

## 2018-08-24 DIAGNOSIS — I639 Cerebral infarction, unspecified: Secondary | ICD-10-CM

## 2018-08-25 NOTE — Progress Notes (Signed)
[facility] Summary Report / Loop Recorder 

## 2018-08-28 LAB — CUP PACEART REMOTE DEVICE CHECK
MDC IDC PG IMPLANT DT: 20181102
MDC IDC SESS DTM: 20190818003830

## 2018-09-01 DIAGNOSIS — H60311 Diffuse otitis externa, right ear: Secondary | ICD-10-CM | POA: Insufficient documentation

## 2018-09-04 LAB — CUP PACEART REMOTE DEVICE CHECK
Date Time Interrogation Session: 20190920013907
MDC IDC PG IMPLANT DT: 20181102

## 2018-09-26 ENCOUNTER — Ambulatory Visit (INDEPENDENT_AMBULATORY_CARE_PROVIDER_SITE_OTHER): Payer: Medicare Other | Admitting: *Deleted

## 2018-09-26 DIAGNOSIS — I639 Cerebral infarction, unspecified: Secondary | ICD-10-CM | POA: Diagnosis not present

## 2018-09-26 DIAGNOSIS — Z23 Encounter for immunization: Secondary | ICD-10-CM | POA: Diagnosis not present

## 2018-09-27 NOTE — Progress Notes (Signed)
[facility] Summary Report / Loop Recorder 

## 2018-10-03 DIAGNOSIS — H2513 Age-related nuclear cataract, bilateral: Secondary | ICD-10-CM | POA: Diagnosis not present

## 2018-10-03 DIAGNOSIS — H5203 Hypermetropia, bilateral: Secondary | ICD-10-CM | POA: Diagnosis not present

## 2018-10-03 DIAGNOSIS — H34212 Partial retinal artery occlusion, left eye: Secondary | ICD-10-CM | POA: Diagnosis not present

## 2018-10-03 DIAGNOSIS — H02102 Unspecified ectropion of right lower eyelid: Secondary | ICD-10-CM | POA: Diagnosis not present

## 2018-10-19 LAB — CUP PACEART REMOTE DEVICE CHECK
Implantable Pulse Generator Implant Date: 20181102
MDC IDC SESS DTM: 20191023020837

## 2018-10-30 ENCOUNTER — Ambulatory Visit (INDEPENDENT_AMBULATORY_CARE_PROVIDER_SITE_OTHER): Payer: Medicare Other

## 2018-10-30 DIAGNOSIS — I639 Cerebral infarction, unspecified: Secondary | ICD-10-CM

## 2018-10-30 NOTE — Progress Notes (Signed)
[facility] Summary Report / Loop Recorder 

## 2018-12-01 ENCOUNTER — Ambulatory Visit (INDEPENDENT_AMBULATORY_CARE_PROVIDER_SITE_OTHER): Payer: Medicare Other

## 2018-12-01 DIAGNOSIS — I639 Cerebral infarction, unspecified: Secondary | ICD-10-CM

## 2018-12-03 LAB — CUP PACEART REMOTE DEVICE CHECK
Implantable Pulse Generator Implant Date: 20181102
MDC IDC SESS DTM: 20191228031341

## 2018-12-04 NOTE — Progress Notes (Signed)
[facility] Summary Report / Loop Recorder 

## 2018-12-19 DIAGNOSIS — H2513 Age-related nuclear cataract, bilateral: Secondary | ICD-10-CM | POA: Diagnosis not present

## 2018-12-19 DIAGNOSIS — H18413 Arcus senilis, bilateral: Secondary | ICD-10-CM | POA: Diagnosis not present

## 2018-12-19 DIAGNOSIS — H25013 Cortical age-related cataract, bilateral: Secondary | ICD-10-CM | POA: Diagnosis not present

## 2018-12-19 DIAGNOSIS — H34232 Retinal artery branch occlusion, left eye: Secondary | ICD-10-CM | POA: Diagnosis not present

## 2018-12-19 DIAGNOSIS — H2512 Age-related nuclear cataract, left eye: Secondary | ICD-10-CM | POA: Diagnosis not present

## 2018-12-22 LAB — CUP PACEART REMOTE DEVICE CHECK
MDC IDC PG IMPLANT DT: 20181102
MDC IDC SESS DTM: 20191125024011

## 2018-12-25 DIAGNOSIS — E1169 Type 2 diabetes mellitus with other specified complication: Secondary | ICD-10-CM | POA: Diagnosis not present

## 2018-12-25 DIAGNOSIS — E782 Mixed hyperlipidemia: Secondary | ICD-10-CM | POA: Diagnosis not present

## 2018-12-25 DIAGNOSIS — I69398 Other sequelae of cerebral infarction: Secondary | ICD-10-CM | POA: Diagnosis not present

## 2018-12-25 DIAGNOSIS — Z79899 Other long term (current) drug therapy: Secondary | ICD-10-CM | POA: Diagnosis not present

## 2018-12-25 DIAGNOSIS — Z0000 Encounter for general adult medical examination without abnormal findings: Secondary | ICD-10-CM | POA: Diagnosis not present

## 2018-12-25 DIAGNOSIS — I10 Essential (primary) hypertension: Secondary | ICD-10-CM | POA: Diagnosis not present

## 2018-12-26 ENCOUNTER — Telehealth: Payer: Self-pay | Admitting: Cardiology

## 2018-12-26 NOTE — Telephone Encounter (Signed)
Patient called and stated that he feels a vibration every now and again. I informed him that his loop recorder does not vibrate. He isn't sure if something is wrong w/ his heart or not. Device Tech RN spoke w/ the patient and she informed him that he has had no episodes from his device. Pt insisted on seeing a cardiologist. Pt agreed to an appt on 01-10-2019 at 3:15.

## 2019-01-03 ENCOUNTER — Ambulatory Visit (INDEPENDENT_AMBULATORY_CARE_PROVIDER_SITE_OTHER): Payer: Medicare Other

## 2019-01-03 DIAGNOSIS — I639 Cerebral infarction, unspecified: Secondary | ICD-10-CM

## 2019-01-04 NOTE — Progress Notes (Signed)
[facility] Summary Report / Loop Recorder 

## 2019-01-05 LAB — CUP PACEART REMOTE DEVICE CHECK
Date Time Interrogation Session: 20200130030854
Implantable Pulse Generator Implant Date: 20181102

## 2019-01-09 ENCOUNTER — Encounter: Payer: Self-pay | Admitting: Cardiology

## 2019-01-10 ENCOUNTER — Encounter: Payer: Self-pay | Admitting: *Deleted

## 2019-01-10 ENCOUNTER — Encounter: Payer: Self-pay | Admitting: Cardiology

## 2019-01-10 ENCOUNTER — Ambulatory Visit: Payer: Medicare Other | Admitting: Cardiology

## 2019-01-10 VITALS — BP 132/82 | HR 61 | Ht 67.0 in | Wt 189.0 lb

## 2019-01-10 DIAGNOSIS — I639 Cerebral infarction, unspecified: Secondary | ICD-10-CM | POA: Diagnosis not present

## 2019-01-10 NOTE — Progress Notes (Signed)
Electrophysiology Office Note   Date:  01/10/2019   ID:  Zachary Calhoun, Zachary Calhoun October 21, 1941, MRN [id number]  PCP:  [facility]  Cardiologist:   Primary Electrophysiologist:  Zachary Brasel Meredith Leeds, MD    No chief complaint on file.    History of Present Illness: Zachary Calhoun is a 78 y.o. male who is being seen today for the evaluation of CVA at the request of [facility] Presenting today for electrophysiology evaluation.  He has a history significant for hypertension, hyperlipidemia.  He had a stroke 10/2017 and had a Linq monitor implanted.    Today, he denies symptoms of palpitations, chest pain, shortness of breath, orthopnea, PND, lower extremity edema, claudication, dizziness, presyncope, syncope, bleeding, or neurologic sequela. The patient is tolerating medications without difficulties.  He has been having vibration on the left side of his chest around his Linq monitor.  The vibration started approximately 1 month ago.  He presented to his primary physician who found no issue.  His primary physician thus referred him to cardiology.  The vibration issue stopped 1 week ago.   Past Medical History:  Diagnosis Date  . Controlled type 2 diabetes mellitus (HCC)    WITHOUT COMPLICATION, WITHOUT LONG-TERM CURRENT USE OF INSULIN  . CVA (cerebral vascular accident) ([place])    DUE TO THROMBOSIS OF LEFT CEREBELLAR ARTERY  . History of gout X 1   "left big toe"  . History of kidney stones    "no OR" (10/05/2017)  . HLD (hyperlipidemia)   . Hypertension   . Hypertriglyceridemia   . Stroke [facility])    Past Surgical History:  Procedure Laterality Date  . APPENDECTOMY    . COLONOSCOPY     DR. Lyndel Calhoun  . FLEXIBLE SIGMOIDOSCOPY  06/22/1993   WAS NORMAL TO 65 CMS  . INGUINAL HERNIA REPAIR Bilateral   . LOOP RECORDER INSERTION N/A 10/07/2017   Procedure: LOOP RECORDER INSERTION;  Surgeon: Zachary Haw, MD;  Location: [facility];  Service:  Cardiovascular;  Laterality: N/A;  . TEE WITHOUT CARDIOVERSION N/A 10/07/2017   Procedure: TRANSESOPHAGEAL ECHOCARDIOGRAM (TEE);  Surgeon: Zachary Spark, MD;  Location: [facility];  Service: Cardiovascular;  Laterality: N/A;     Current Outpatient Medications  Medication Sig Dispense Refill  . atorvastatin (LIPITOR) 40 MG tablet Take 40 mg by mouth daily.    . captopril (CAPOTEN) 50 MG tablet 50 mg 2 (two) times daily.     . clopidogrel (PLAVIX) 75 MG tablet Take 1 tablet (75 mg total) by mouth daily. 30 tablet 0  . Multiple Vitamin (MULTIVITAMIN) tablet Take 1 tablet daily by mouth.    . niacin 500 MG tablet Take 500 mg daily by mouth.    . Omega-3 Fatty Acids (FISH OIL) 1000 MG CAPS Take 1,000 mg by mouth daily.     Current Facility-Administered Medications  Medication Dose Route Frequency Provider Last Rate Last Dose  . 0.9 %  sodium chloride infusion  500 mL Intravenous Once Armbruster, Zachary Raspberry, MD        Allergies:   Hydrochlorothiazide   Social History:  The patient  reports that he has never smoked. He has never used smokeless tobacco. He reports that he does not drink alcohol or use drugs.   Family History:  The patient's family history includes Stroke in his mother.    ROS:  Please see the history of present illness.   Otherwise, review of systems is positive for none.  All other systems are reviewed and negative.    PHYSICAL EXAM: VS:  BP 132/82   Pulse 61   Ht 5\' 7"  (1.702 m)   Wt 189 lb (85.7 kg)   BMI 29.60 kg/m  , BMI Body mass index is 29.6 kg/m. GEN: Well nourished, well developed, in no acute distress  HEENT: normal  Neck: no JVD, carotid bruits, or masses Cardiac: RRR; no murmurs, rubs, or gallops,no edema  Respiratory:  clear to auscultation bilaterally, normal work of breathing GI: soft, nontender, nondistended, + BS MS: no deformity or atrophy  Skin: warm and dry, device pocket is well healed Neuro:  Strength and sensation are intact Psych:  euthymic mood, full affect  EKG:  EKG is ordered today. Personal review of the ekg ordered shows this rhythm, right bundle branch block  Device interrogation is reviewed today in detail.  See PaceArt for details.   Recent Labs: No results found for requested labs within last 8760 hours.    Lipid Panel     Component Value Date/Time   CHOL 87 10/06/2017 0342   TRIG 171 (H) 10/06/2017 0342   HDL 25 (L) 10/06/2017 0342   CHOLHDL 3.5 10/06/2017 0342   VLDL 34 10/06/2017 0342   LDLCALC 28 10/06/2017 0342     Wt Readings from Last 3 Encounters:  01/10/19 189 lb (85.7 kg)  06/07/18 183 lb (83 kg)  05/23/18 183 lb 2 oz (83.1 kg)      Other studies Reviewed: Additional studies/ records that were reviewed today include: TTE 10/06/2017 Review of the above records today demonstrates:  - Left ventricle: The cavity size was normal. Systolic function was   normal. The estimated ejection fraction was in the range of 55%   to 60%. Wall motion was normal; there were no regional wall   motion abnormalities. - Aortic valve: Trileaflet; mildly thickened, mildly calcified   leaflets. Valve area (Vmax): 1.87 cm^2. - Mitral valve: There was mild regurgitation. - Pulmonary arteries: Systolic pressure was mildly increased. PA   peak pressure: 31 mm Hg (S).   ASSESSMENT AND PLAN:  1.  Cryptogenic stroke: Status post Linq implantation 10/07/2017.  No arrhythmias noted on device interrogation.  2.  Hypertension: Currently well controlled.  Plan per primary care.  3.  Mild aortic stenosis: Patient is asymptomatic.  Saivion Goettel likely need an echo in the next 5 years.  4.  Chest vibrations: At this point it is unclear to me as to the cause of a his chest vibration issue.  It is unlikely that it is cardiac in nature.  This is most likely due to a musculoskeletal issue.  No further work-up necessary.  Current medicines are reviewed at length with the patient today.   The patient does not have concerns  regarding his medicines.  The following changes were made today:  none  Labs/ tests ordered today include:  Orders Placed This Encounter  Procedures  . EKG 12-Lead     Disposition:   FU with Kamaryn Grimley as needed  Signed, Sharnita Bogucki Meredith Leeds, MD  01/10/2019 3:36 PM     [facility] [place] [address] [place] [address] [phone] (office) [phone] (fax)

## 2019-01-10 NOTE — Patient Instructions (Signed)
Medication Instructions:  Your physician recommends that you continue on your current medications as directed. Please refer to the Current Medication list given to you today.  * If you need a refill on your cardiac medications before your next appointment, please call your pharmacy.   Labwork: None ordered  Testing/Procedures: None ordered  Follow-Up: No follow up is needed at this time with Dr. Camnitz.  He will see you on an as needed basis.   Thank you for choosing [facility]!!   Kelsey Durflinger, RN [phone]     

## 2019-01-11 LAB — CUP PACEART INCLINIC DEVICE CHECK
Date Time Interrogation Session: 20200206104601
MDC IDC PG IMPLANT DT: 20181102

## 2019-01-22 DIAGNOSIS — H2512 Age-related nuclear cataract, left eye: Secondary | ICD-10-CM | POA: Diagnosis not present

## 2019-01-23 DIAGNOSIS — H2511 Age-related nuclear cataract, right eye: Secondary | ICD-10-CM | POA: Diagnosis not present

## 2019-02-05 ENCOUNTER — Ambulatory Visit (INDEPENDENT_AMBULATORY_CARE_PROVIDER_SITE_OTHER): Payer: Medicare Other | Admitting: *Deleted

## 2019-02-05 DIAGNOSIS — I639 Cerebral infarction, unspecified: Secondary | ICD-10-CM | POA: Diagnosis not present

## 2019-02-06 DIAGNOSIS — M79651 Pain in right thigh: Secondary | ICD-10-CM | POA: Diagnosis not present

## 2019-02-06 DIAGNOSIS — M79604 Pain in right leg: Secondary | ICD-10-CM | POA: Diagnosis not present

## 2019-02-06 DIAGNOSIS — M7989 Other specified soft tissue disorders: Secondary | ICD-10-CM | POA: Diagnosis not present

## 2019-02-06 LAB — CUP PACEART REMOTE DEVICE CHECK
Date Time Interrogation Session: 20200303030842
Implantable Pulse Generator Implant Date: 20181102

## 2019-02-12 DIAGNOSIS — H2511 Age-related nuclear cataract, right eye: Secondary | ICD-10-CM | POA: Diagnosis not present

## 2019-02-13 NOTE — Progress Notes (Signed)
[facility] Summary Report / Loop Recorder 

## 2019-02-21 DIAGNOSIS — R1031 Right lower quadrant pain: Secondary | ICD-10-CM | POA: Diagnosis not present

## 2019-03-01 DIAGNOSIS — M1611 Unilateral primary osteoarthritis, right hip: Secondary | ICD-10-CM | POA: Diagnosis not present

## 2019-03-12 ENCOUNTER — Ambulatory Visit (INDEPENDENT_AMBULATORY_CARE_PROVIDER_SITE_OTHER): Payer: Medicare Other | Admitting: *Deleted

## 2019-03-12 ENCOUNTER — Other Ambulatory Visit: Payer: Self-pay

## 2019-03-12 DIAGNOSIS — I639 Cerebral infarction, unspecified: Secondary | ICD-10-CM

## 2019-03-12 LAB — CUP PACEART REMOTE DEVICE CHECK
Date Time Interrogation Session: 20200405104052
Implantable Pulse Generator Implant Date: 20181102

## 2019-03-21 NOTE — Progress Notes (Signed)
[facility] Summary Report / Loop Recorder 

## 2019-04-12 DIAGNOSIS — M10071 Idiopathic gout, right ankle and foot: Secondary | ICD-10-CM | POA: Diagnosis not present

## 2019-04-16 ENCOUNTER — Ambulatory Visit (INDEPENDENT_AMBULATORY_CARE_PROVIDER_SITE_OTHER): Payer: Medicare Other | Admitting: *Deleted

## 2019-04-16 ENCOUNTER — Other Ambulatory Visit: Payer: Self-pay

## 2019-04-16 DIAGNOSIS — I639 Cerebral infarction, unspecified: Secondary | ICD-10-CM

## 2019-04-16 LAB — CUP PACEART REMOTE DEVICE CHECK
Date Time Interrogation Session: 20200508134114
Implantable Pulse Generator Implant Date: 20181102

## 2019-04-25 ENCOUNTER — Telehealth: Payer: Self-pay | Admitting: Cardiology

## 2019-04-25 NOTE — Telephone Encounter (Signed)
New message      [facility] Pre-operative Risk Assessment    Request for surgical clearance:  1. What type of surgery is being performed? Right hip injection under fluroscopy  2. When is this surgery scheduled? 05/02/2019  3. What type of clearance is required (medical clearance vs. Pharmacy clearance to hold med vs. Both)? Pharmacy  4. Are there any medications that need to be held prior to surgery and how long?Please hold plavix for 5 days  5. Practice name and name of physician performing surgery? Interventional Radiologist at [facility]  6. What is your office phone number? [phone]   7.   What is your office fax number [phone]   8.   Anesthesia type (None, local, MAC, general) ? No    Zachary Calhoun 04/25/2019, 2:44 PM  _________________________________________________________________   (provider comments below)

## 2019-04-25 NOTE — Telephone Encounter (Signed)
   Primary Hatch, MD  Chart reviewed as part of pre-operative protocol coverage.   We have been asked to comment on holding plavix. It appears he is on plavix for stroke, which we do not manage. Please contact his neurologist or PCP for recommendations on holding plavix.  Will route this bundled recommendation to requesting provider via Epic fax function. Please call with questions.  Tami Lin Cohl Behrens, PA 04/25/2019, 3:15 PM

## 2019-04-26 NOTE — Progress Notes (Signed)
[facility] Summary Report / Loop Recorder 

## 2019-05-02 DIAGNOSIS — M1611 Unilateral primary osteoarthritis, right hip: Secondary | ICD-10-CM | POA: Diagnosis not present

## 2019-05-02 DIAGNOSIS — M25551 Pain in right hip: Secondary | ICD-10-CM | POA: Diagnosis not present

## 2019-05-16 ENCOUNTER — Ambulatory Visit (INDEPENDENT_AMBULATORY_CARE_PROVIDER_SITE_OTHER): Payer: Medicare Other | Admitting: *Deleted

## 2019-05-16 DIAGNOSIS — I639 Cerebral infarction, unspecified: Secondary | ICD-10-CM | POA: Diagnosis not present

## 2019-05-17 LAB — CUP PACEART REMOTE DEVICE CHECK
Date Time Interrogation Session: 20200610134253
Implantable Pulse Generator Implant Date: 20181102

## 2019-05-24 NOTE — Progress Notes (Signed)
[facility] Summary Report / Loop Recorder 

## 2019-06-18 ENCOUNTER — Ambulatory Visit (INDEPENDENT_AMBULATORY_CARE_PROVIDER_SITE_OTHER): Payer: Medicare Other | Admitting: *Deleted

## 2019-06-18 DIAGNOSIS — I639 Cerebral infarction, unspecified: Secondary | ICD-10-CM | POA: Diagnosis not present

## 2019-06-18 LAB — CUP PACEART REMOTE DEVICE CHECK
Date Time Interrogation Session: 20200713131704
Implantable Pulse Generator Implant Date: 20181102

## 2019-06-26 ENCOUNTER — Encounter: Payer: Self-pay | Admitting: Gastroenterology

## 2019-06-29 NOTE — Progress Notes (Signed)
[facility] Summary Report / Loop Recorder 

## 2019-07-12 DIAGNOSIS — M1611 Unilateral primary osteoarthritis, right hip: Secondary | ICD-10-CM | POA: Diagnosis not present

## 2019-07-22 LAB — CUP PACEART REMOTE DEVICE CHECK
Date Time Interrogation Session: 20200815154136
Implantable Pulse Generator Implant Date: 20181102

## 2019-07-23 ENCOUNTER — Ambulatory Visit (INDEPENDENT_AMBULATORY_CARE_PROVIDER_SITE_OTHER): Payer: Medicare Other | Admitting: *Deleted

## 2019-07-23 DIAGNOSIS — I639 Cerebral infarction, unspecified: Secondary | ICD-10-CM

## 2019-08-01 NOTE — Progress Notes (Signed)
[facility] Summary Report / Loop Recorder 

## 2019-08-23 ENCOUNTER — Ambulatory Visit (INDEPENDENT_AMBULATORY_CARE_PROVIDER_SITE_OTHER): Payer: Medicare Other | Admitting: *Deleted

## 2019-08-23 DIAGNOSIS — I639 Cerebral infarction, unspecified: Secondary | ICD-10-CM | POA: Diagnosis not present

## 2019-08-23 LAB — CUP PACEART REMOTE DEVICE CHECK
Date Time Interrogation Session: 20200917155935
Implantable Pulse Generator Implant Date: 20181102

## 2019-08-27 NOTE — Progress Notes (Signed)
[facility] Summary Report / Loop Recorder 

## 2019-09-24 DIAGNOSIS — Z23 Encounter for immunization: Secondary | ICD-10-CM | POA: Diagnosis not present

## 2019-09-25 ENCOUNTER — Ambulatory Visit (INDEPENDENT_AMBULATORY_CARE_PROVIDER_SITE_OTHER): Payer: Medicare Other | Admitting: *Deleted

## 2019-09-25 DIAGNOSIS — I639 Cerebral infarction, unspecified: Secondary | ICD-10-CM

## 2019-09-26 LAB — CUP PACEART REMOTE DEVICE CHECK
Date Time Interrogation Session: 20201020161300
Implantable Pulse Generator Implant Date: 20181102

## 2019-10-11 NOTE — Progress Notes (Signed)
[facility] Summary Report / Loop Recorder 

## 2019-10-29 ENCOUNTER — Ambulatory Visit (INDEPENDENT_AMBULATORY_CARE_PROVIDER_SITE_OTHER): Payer: Medicare Other | Admitting: *Deleted

## 2019-10-29 DIAGNOSIS — I639 Cerebral infarction, unspecified: Secondary | ICD-10-CM | POA: Diagnosis not present

## 2019-10-29 LAB — CUP PACEART REMOTE DEVICE CHECK
Date Time Interrogation Session: 20201122111934
Implantable Pulse Generator Implant Date: 20181102

## 2019-11-28 DIAGNOSIS — M109 Gout, unspecified: Secondary | ICD-10-CM | POA: Diagnosis not present

## 2019-11-29 ENCOUNTER — Ambulatory Visit (INDEPENDENT_AMBULATORY_CARE_PROVIDER_SITE_OTHER): Payer: Medicare Other | Admitting: *Deleted

## 2019-11-29 DIAGNOSIS — I639 Cerebral infarction, unspecified: Secondary | ICD-10-CM | POA: Diagnosis not present

## 2019-11-29 LAB — CUP PACEART REMOTE DEVICE CHECK
Date Time Interrogation Session: 20201223230836
Implantable Pulse Generator Implant Date: 20181102

## 2019-12-02 NOTE — Progress Notes (Signed)
ILR remote 

## 2019-12-27 DIAGNOSIS — E782 Mixed hyperlipidemia: Secondary | ICD-10-CM | POA: Diagnosis not present

## 2019-12-27 DIAGNOSIS — M109 Gout, unspecified: Secondary | ICD-10-CM | POA: Diagnosis not present

## 2019-12-27 DIAGNOSIS — Z79899 Other long term (current) drug therapy: Secondary | ICD-10-CM | POA: Diagnosis not present

## 2019-12-27 DIAGNOSIS — I10 Essential (primary) hypertension: Secondary | ICD-10-CM | POA: Diagnosis not present

## 2019-12-27 DIAGNOSIS — E785 Hyperlipidemia, unspecified: Secondary | ICD-10-CM | POA: Diagnosis not present

## 2019-12-27 DIAGNOSIS — Z0000 Encounter for general adult medical examination without abnormal findings: Secondary | ICD-10-CM | POA: Diagnosis not present

## 2019-12-27 DIAGNOSIS — E1169 Type 2 diabetes mellitus with other specified complication: Secondary | ICD-10-CM | POA: Diagnosis not present

## 2019-12-31 ENCOUNTER — Ambulatory Visit (INDEPENDENT_AMBULATORY_CARE_PROVIDER_SITE_OTHER): Payer: Medicare Other | Admitting: *Deleted

## 2019-12-31 DIAGNOSIS — I639 Cerebral infarction, unspecified: Secondary | ICD-10-CM

## 2019-12-31 LAB — CUP PACEART REMOTE DEVICE CHECK
Date Time Interrogation Session: 20210124232757
Implantable Pulse Generator Implant Date: 20181102

## 2020-01-04 ENCOUNTER — Telehealth: Payer: Self-pay

## 2020-01-04 NOTE — Telephone Encounter (Signed)
The pt thought his loop battery was dead because his monitor said the battery was dead. I told him his battery in his loop recorder is still good. His 3 years will not be until 10/07/2020. I let him know it will be the monitor itself that is dead. I conference the pt with [employer] to trouble shoot the monitor. He states that his bill went up and wanted to know why. I told him that is a question for his insurance company. [employer] is going to call him in one hour to further trouble shoot his monitor.

## 2020-01-31 ENCOUNTER — Ambulatory Visit (INDEPENDENT_AMBULATORY_CARE_PROVIDER_SITE_OTHER): Payer: Medicare Other | Admitting: *Deleted

## 2020-01-31 DIAGNOSIS — I639 Cerebral infarction, unspecified: Secondary | ICD-10-CM | POA: Diagnosis not present

## 2020-01-31 LAB — CUP PACEART REMOTE DEVICE CHECK
Date Time Interrogation Session: 20210225000245
Implantable Pulse Generator Implant Date: 20181102

## 2020-01-31 NOTE — Progress Notes (Signed)
ILR Remote 

## 2020-02-21 DIAGNOSIS — M1611 Unilateral primary osteoarthritis, right hip: Secondary | ICD-10-CM | POA: Diagnosis not present

## 2020-02-29 DIAGNOSIS — M25551 Pain in right hip: Secondary | ICD-10-CM | POA: Diagnosis not present

## 2020-02-29 DIAGNOSIS — M1611 Unilateral primary osteoarthritis, right hip: Secondary | ICD-10-CM | POA: Diagnosis not present

## 2020-03-02 LAB — CUP PACEART REMOTE DEVICE CHECK
Date Time Interrogation Session: 20210328023136
Implantable Pulse Generator Implant Date: 20181102

## 2020-03-03 ENCOUNTER — Ambulatory Visit (INDEPENDENT_AMBULATORY_CARE_PROVIDER_SITE_OTHER): Payer: Medicare Other | Admitting: *Deleted

## 2020-03-03 DIAGNOSIS — I639 Cerebral infarction, unspecified: Secondary | ICD-10-CM | POA: Diagnosis not present

## 2020-03-03 NOTE — Progress Notes (Signed)
ILR Remote 

## 2020-04-03 LAB — CUP PACEART REMOTE DEVICE CHECK
Date Time Interrogation Session: 20210428232631
Implantable Pulse Generator Implant Date: 20181102

## 2020-04-07 ENCOUNTER — Ambulatory Visit (INDEPENDENT_AMBULATORY_CARE_PROVIDER_SITE_OTHER): Payer: Medicare Other | Admitting: *Deleted

## 2020-04-07 DIAGNOSIS — I639 Cerebral infarction, unspecified: Secondary | ICD-10-CM | POA: Diagnosis not present

## 2020-04-08 NOTE — Progress Notes (Signed)
[facility] Summary Report / Loop Recorder 

## 2020-05-06 ENCOUNTER — Ambulatory Visit (INDEPENDENT_AMBULATORY_CARE_PROVIDER_SITE_OTHER): Payer: Medicare Other | Admitting: *Deleted

## 2020-05-06 DIAGNOSIS — I639 Cerebral infarction, unspecified: Secondary | ICD-10-CM

## 2020-05-07 LAB — CUP PACEART REMOTE DEVICE CHECK
Date Time Interrogation Session: 20210529233441
Implantable Pulse Generator Implant Date: 20181102

## 2020-05-07 NOTE — Progress Notes (Signed)
[facility] Summary Report / Loop Recorder 

## 2020-06-06 ENCOUNTER — Ambulatory Visit (INDEPENDENT_AMBULATORY_CARE_PROVIDER_SITE_OTHER): Payer: Medicare Other | Admitting: *Deleted

## 2020-06-06 DIAGNOSIS — I639 Cerebral infarction, unspecified: Secondary | ICD-10-CM | POA: Diagnosis not present

## 2020-06-06 LAB — CUP PACEART REMOTE DEVICE CHECK
Date Time Interrogation Session: 20210701231547
Implantable Pulse Generator Implant Date: 20181102

## 2020-06-11 NOTE — Progress Notes (Signed)
[facility] Summary Report / Loop Recorder 

## 2020-07-12 LAB — CUP PACEART REMOTE DEVICE CHECK
Date Time Interrogation Session: 20210803231606
Implantable Pulse Generator Implant Date: 20181102

## 2020-07-14 ENCOUNTER — Ambulatory Visit (INDEPENDENT_AMBULATORY_CARE_PROVIDER_SITE_OTHER): Payer: Medicare Other | Admitting: *Deleted

## 2020-07-14 DIAGNOSIS — I639 Cerebral infarction, unspecified: Secondary | ICD-10-CM | POA: Diagnosis not present

## 2020-07-15 NOTE — Progress Notes (Signed)
[facility] Summary Report / Loop Recorder 

## 2020-08-13 LAB — CUP PACEART REMOTE DEVICE CHECK
Date Time Interrogation Session: 20210905233449
Implantable Pulse Generator Implant Date: 20181102

## 2020-08-18 ENCOUNTER — Ambulatory Visit (INDEPENDENT_AMBULATORY_CARE_PROVIDER_SITE_OTHER): Payer: Medicare Other | Admitting: *Deleted

## 2020-08-18 DIAGNOSIS — I639 Cerebral infarction, unspecified: Secondary | ICD-10-CM | POA: Diagnosis not present

## 2020-08-20 NOTE — Progress Notes (Signed)
[facility] Summary Report / Loop Recorder 

## 2020-09-13 LAB — CUP PACEART REMOTE DEVICE CHECK
Date Time Interrogation Session: 20211008233755
Implantable Pulse Generator Implant Date: 20181102

## 2020-09-22 ENCOUNTER — Ambulatory Visit (INDEPENDENT_AMBULATORY_CARE_PROVIDER_SITE_OTHER): Payer: Medicare Other

## 2020-09-22 DIAGNOSIS — I639 Cerebral infarction, unspecified: Secondary | ICD-10-CM | POA: Diagnosis not present

## 2020-09-26 NOTE — Progress Notes (Signed)
[facility] Summary Report / Loop Recorder 

## 2020-10-27 ENCOUNTER — Ambulatory Visit (INDEPENDENT_AMBULATORY_CARE_PROVIDER_SITE_OTHER): Payer: Medicare Other

## 2020-10-27 DIAGNOSIS — I639 Cerebral infarction, unspecified: Secondary | ICD-10-CM

## 2020-10-27 LAB — CUP PACEART REMOTE DEVICE CHECK
Date Time Interrogation Session: 20211121233522
Implantable Pulse Generator Implant Date: 20181102

## 2020-10-29 NOTE — Progress Notes (Signed)
[facility] Summary Report / Loop Recorder 

## 2020-11-05 DIAGNOSIS — M1611 Unilateral primary osteoarthritis, right hip: Secondary | ICD-10-CM | POA: Diagnosis not present

## 2020-11-13 DIAGNOSIS — M25551 Pain in right hip: Secondary | ICD-10-CM | POA: Diagnosis not present

## 2020-11-13 DIAGNOSIS — M1611 Unilateral primary osteoarthritis, right hip: Secondary | ICD-10-CM | POA: Diagnosis not present

## 2020-12-01 ENCOUNTER — Ambulatory Visit (INDEPENDENT_AMBULATORY_CARE_PROVIDER_SITE_OTHER): Payer: Medicare Other

## 2020-12-01 DIAGNOSIS — I639 Cerebral infarction, unspecified: Secondary | ICD-10-CM | POA: Diagnosis not present

## 2020-12-01 LAB — CUP PACEART REMOTE DEVICE CHECK
Date Time Interrogation Session: 20211224233725
Implantable Pulse Generator Implant Date: 20181102

## 2020-12-12 NOTE — Progress Notes (Signed)
[facility] Summary Report / Loop Recorder 

## 2021-01-02 LAB — CUP PACEART REMOTE DEVICE CHECK
Date Time Interrogation Session: 20220126234217
Implantable Pulse Generator Implant Date: 20181102

## 2021-01-05 ENCOUNTER — Ambulatory Visit (INDEPENDENT_AMBULATORY_CARE_PROVIDER_SITE_OTHER): Payer: Medicare Other

## 2021-01-05 DIAGNOSIS — I639 Cerebral infarction, unspecified: Secondary | ICD-10-CM | POA: Diagnosis not present

## 2021-01-12 DIAGNOSIS — Z780 Asymptomatic menopausal state: Secondary | ICD-10-CM | POA: Diagnosis not present

## 2021-01-12 DIAGNOSIS — E1169 Type 2 diabetes mellitus with other specified complication: Secondary | ICD-10-CM | POA: Diagnosis not present

## 2021-01-12 DIAGNOSIS — Z0000 Encounter for general adult medical examination without abnormal findings: Secondary | ICD-10-CM | POA: Diagnosis not present

## 2021-01-12 DIAGNOSIS — E782 Mixed hyperlipidemia: Secondary | ICD-10-CM | POA: Diagnosis not present

## 2021-01-12 DIAGNOSIS — M1611 Unilateral primary osteoarthritis, right hip: Secondary | ICD-10-CM | POA: Diagnosis not present

## 2021-01-12 DIAGNOSIS — M8580 Other specified disorders of bone density and structure, unspecified site: Secondary | ICD-10-CM | POA: Diagnosis not present

## 2021-01-12 DIAGNOSIS — M8589 Other specified disorders of bone density and structure, multiple sites: Secondary | ICD-10-CM | POA: Diagnosis not present

## 2021-01-12 DIAGNOSIS — E785 Hyperlipidemia, unspecified: Secondary | ICD-10-CM | POA: Diagnosis not present

## 2021-01-12 DIAGNOSIS — Z79899 Other long term (current) drug therapy: Secondary | ICD-10-CM | POA: Diagnosis not present

## 2021-01-14 NOTE — Progress Notes (Signed)
[facility] Summary Report / Loop Recorder 

## 2021-02-06 DIAGNOSIS — M25551 Pain in right hip: Secondary | ICD-10-CM | POA: Diagnosis not present

## 2021-02-09 ENCOUNTER — Ambulatory Visit (INDEPENDENT_AMBULATORY_CARE_PROVIDER_SITE_OTHER): Payer: Medicare Other

## 2021-02-09 DIAGNOSIS — I639 Cerebral infarction, unspecified: Secondary | ICD-10-CM

## 2021-02-10 ENCOUNTER — Telehealth: Payer: Self-pay

## 2021-02-10 NOTE — Telephone Encounter (Signed)
   [facility] Pre-operative Risk Assessment     Request for surgical clearance:  1. What type of surgery is being performed? Total Hip Replacement   2. When is this surgery scheduled? TBD  3. What type of clearance is required (medical clearance vs. Pharmacy clearance to hold med vs. Both)? BOTH   4. Are there any medications that need to be held prior to surgery and how long? PLAVIX   5. Practice name and name of physician performing surgery? [facility] Dr. Edmonia Lynch   6. What is the office phone number? Echo.   What is the office fax number? [phone] attn: Sherri   8.   Anesthesia type (None, local, MAC, general) ?

## 2021-02-10 NOTE — Progress Notes (Signed)
Cardiology Office Note Date:  02/11/2021  Patient ID:  Zachary Calhoun, Zachary Calhoun 10-24-1941, MRN [id number] PCP:  [facility]  Electrophysiologist: Dr. Curt Bears    Chief Complaint: pre-op  History of Present Illness: Zachary Calhoun is a 80 y.o. male with history of HTN, HLD, stroke.  He comes in today to be seen for Dr. Curt Bears, last seen by him Feb 2020 with some perceived vibration nearing his vibration near the loop recorder, no othe complaints.  Vibration had stopped and no issues were found.  Recommended echo in 5 years 2/2 mild AS.   TODAY He does very well.  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  Says the dirt/field ground is much easier on his hip then hard surfaces. He is very active, denies any CP, palpitations or cardiac awareness. No dizzy spells, near syncope or syncope. No SOB or DOE   RCRI is one, 0.9% risk  Device information MDT loop recorder implanted 10/07/2017 cryptogenic stoke   Past Medical History:  Diagnosis Date  . Controlled type 2 diabetes mellitus (HCC)    WITHOUT COMPLICATION, WITHOUT LONG-TERM CURRENT USE OF INSULIN  . CVA (cerebral vascular accident) ([place])    DUE TO THROMBOSIS OF LEFT CEREBELLAR ARTERY  . History of gout X 1   "left big toe"  . History of kidney stones    "no OR" (10/05/2017)  . HLD (hyperlipidemia)   . Hypertension   . Hypertriglyceridemia   . Stroke [facility])     Past Surgical History:  Procedure Laterality Date  . APPENDECTOMY    . COLONOSCOPY     DR. Lyndel Safe  . FLEXIBLE SIGMOIDOSCOPY  06/22/1993   WAS NORMAL TO 65 CMS  . INGUINAL HERNIA REPAIR Bilateral   . LOOP RECORDER INSERTION N/A 10/07/2017   Procedure: LOOP RECORDER INSERTION;  Surgeon: Constance Haw, MD;  Location: [facility];  Service: Cardiovascular;  Laterality: N/A;  . TEE WITHOUT CARDIOVERSION N/A 10/07/2017   Procedure: TRANSESOPHAGEAL ECHOCARDIOGRAM (TEE);  Surgeon: Dorothy Spark, MD;  Location:  [facility];  Service: Cardiovascular;  Laterality: N/A;    Current Outpatient Medications  Medication Sig Dispense Refill  . atorvastatin (LIPITOR) 40 MG tablet Take 40 mg by mouth daily.    . captopril (CAPOTEN) 50 MG tablet 50 mg 2 (two) times daily.     . clopidogrel (PLAVIX) 75 MG tablet Take 1 tablet (75 mg total) by mouth daily. 30 tablet 0  . Multiple Vitamin (MULTIVITAMIN) tablet Take 1 tablet daily by mouth.    . niacin 500 MG tablet Take 500 mg daily by mouth.    . Omega-3 Fatty Acids (FISH OIL) 1000 MG CAPS Take 1,000 mg by mouth daily.     Current Facility-Administered Medications  Medication Dose Route Frequency Provider Last Rate Last Admin  . 0.9 %  sodium chloride infusion  500 mL Intravenous Once Armbruster, Carlota Raspberry, MD        Allergies:   Hydrochlorothiazide   Social History:  The patient  reports that he has never smoked. He has never used smokeless tobacco. He reports that he does not drink alcohol and does not use drugs.   Family History:  The patient's family history includes Stroke in his mother.  ROS:  Please see the history of present illness.    All other systems are reviewed and otherwise negative.   PHYSICAL EXAM:  VS:  BP 104/74   Pulse (!) 54  Ht 5\' 7"  (1.702 m)   Wt 185 lb 3.2 oz (84 kg)   SpO2 96%   BMI 29.01 kg/m  BMI: Body mass index is 29.01 kg/m. Well nourished, well developed, in no acute distress HEENT: normocephalic, atraumatic Neck: no JVD, carotid bruits or masses Cardiac:  RRR; soft SM, no rubs, or gallops Lungs:  CTA b/l, no wheezing, rhonchi or rales Abd: soft, nontender MS: no deformity or atrophy Ext: no edema Skin: warm and dry, no rash Neuro:  No gross deficits appreciated Psych: euthymic mood, full affect  ILR site is stable, no tethering or discomfort   EKG:  Done today and reviewed by myself shows  SB/sinus arrhythmia, 54bpm, no ST/T changes  Device interrogation done today and reviewed by myself:  Battery is  good R waves 0.14-0.58mV Interrogate all notes NO AFib, no device observations Good HR histogram   TTE 10/06/2017 Review of the above records today demonstrates:  - Left ventricle: The cavity size was normal. Systolic function was normal. The estimated ejection fraction was in the range of 55% to 60%. Wall motion was normal; there were no regional wall motion abnormalities. - Aortic valve: Trileaflet; mildly thickened, mildly calcified leaflets. Valve area (Vmax): 1.87 cm^2. - Mitral valve: There was mild regurgitation. - Pulmonary arteries: Systolic pressure was mildly increased. PA peak pressure: 31 mm Hg (S).  Recent Labs: No results found for requested labs within last 8760 hours.  No results found for requested labs within last 8760 hours.   CrCl cannot be calculated (Patient's most recent lab result is older than the maximum 21 days allowed.).   Wt Readings from Last 3 Encounters:  02/11/21 185 lb 3.2 oz (84 kg)  01/10/19 189 lb (85.7 kg)  06/07/18 183 lb (83 kg)     Other studies reviewed: Additional studies/records reviewed today include: summarized above  ASSESSMENT AND PLAN:  1. Cryptogenic stroke 2. Loop implant     No AFib to date  3. HTN     Looks good  4. Soft SM on exam     Planned for periodic echos with some AV sclerosis/mild AS  known for him  5. Pre-op, hip replacement     Low cardiac risk score, no cardiac contraindication to hip surgery planned     plavix recommendations are deferred to his PMD.    Disposition: F/u with [place] in a year, sooner if needed    Current medicines are reviewed at length with the patient today.  The patient did not have any concerns regarding medicines.  Venetia Night, PA-C 02/11/2021 8:29 AM     [place] [address] [phone] (office)  [phone] (fax)

## 2021-02-10 NOTE — Telephone Encounter (Signed)
Called the requesting office and spoke with Sherri. I informed her that patient Mr. Zachary Calhoun is schedule to see Tommye Standard, PA-C on 02/11/21 at 8:15 AM.

## 2021-02-10 NOTE — Telephone Encounter (Signed)
Patient is scheduled to see Tommye Standard on 02/11/21 at 8:15 AM. Appointment note reflects preop clearance.

## 2021-02-10 NOTE — Telephone Encounter (Signed)
   Primary Cardiologist: Will Meredith Leeds, MD  Chart reviewed as part of pre-operative protocol coverage. Because of Logan Baltimore Howson's past medical history and time since last visit, he will require a follow-up visit in order to better assess preoperative cardiovascular risk.   To the requesting surgeon - this patient is on plavix for stroke, which we do not manage. Please reach out to PCP/neurology for guidance on holding plavix. We have not seen this patient since 2020 and he will require an appt for cardiac clearance.   Pre-op covering staff: - Please schedule appointment and call patient to inform them. If patient already had an upcoming appointment within acceptable timeframe, please add "pre-op clearance" to the appointment notes so provider is aware. - Please contact requesting surgeon's office via preferred method (i.e, phone, fax) to inform them of need for appointment prior to surgery.  If applicable, this message will also be routed to pharmacy pool and/or primary cardiologist for input on holding anticoagulant/antiplatelet agent as requested below so that this information is available to the clearing provider at time of patient's appointment.   Lane, PA  02/10/2021, 2:10 PM

## 2021-02-11 ENCOUNTER — Ambulatory Visit: Payer: Medicare Other | Admitting: Physician Assistant

## 2021-02-11 ENCOUNTER — Encounter: Payer: Self-pay | Admitting: Physician Assistant

## 2021-02-11 ENCOUNTER — Other Ambulatory Visit: Payer: Self-pay

## 2021-02-11 VITALS — BP 104/74 | HR 54 | Ht 67.0 in | Wt 185.2 lb

## 2021-02-11 DIAGNOSIS — Z01818 Encounter for other preprocedural examination: Secondary | ICD-10-CM | POA: Diagnosis not present

## 2021-02-11 DIAGNOSIS — I639 Cerebral infarction, unspecified: Secondary | ICD-10-CM | POA: Diagnosis not present

## 2021-02-11 DIAGNOSIS — Z4509 Encounter for adjustment and management of other cardiac device: Secondary | ICD-10-CM | POA: Diagnosis not present

## 2021-02-11 LAB — CUP PACEART REMOTE DEVICE CHECK
Date Time Interrogation Session: 20220228235324
Implantable Pulse Generator Implant Date: 20181102

## 2021-02-11 NOTE — Patient Instructions (Signed)
Medication Instructions:   Your physician recommends that you continue on your current medications as directed. Please refer to the Current Medication list given to you today.  *If you need a refill on your cardiac medications before your next appointment, please call your pharmacy*   Lab Work: [place]   If you have labs (blood work) drawn today and your tests are completely normal, you will receive your results only by: Marland Kitchen MyChart Message (if you have MyChart) OR . A paper copy in the mail If you have any lab test that is abnormal or we need to change your treatment, we will call you to review the results.   Testing/Procedures: NONE ORDERED  TODAY    Follow-Up: At [facility], you and your health needs are our priority.  As part of our continuing mission to provide you with exceptional heart care, we have created designated Provider Care Teams.  These Care Teams include your primary Cardiologist (physician) and Advanced Practice Providers (APPs -  Physician Assistants and Nurse Practitioners) who all work together to provide you with the care you need, when you need it.  We recommend signing up for the patient portal called "MyChart".  Sign up information is provided on this After Visit Summary.  MyChart is used to connect with patients for Virtual Visits (Telemedicine).  Patients are able to view lab/test results, encounter notes, upcoming appointments, etc.  Non-urgent messages can be sent to your provider as well.   To learn more about what you can do with MyChart, go to [email or web address].    Your next appointment:   1 year(s)  The format for your next appointment:   In Person  Provider:   You may see Dr. Curt Bears  or one of the following Advanced Practice Providers on your designated Care Team:    Chanetta Marshall, NP  Tommye Standard, PA-C  Legrand Como "Oda Kilts, [place]    Other Instructions

## 2021-02-18 NOTE — Progress Notes (Signed)
[facility] Summary Report / Loop Recorder 

## 2021-03-02 DIAGNOSIS — M25551 Pain in right hip: Secondary | ICD-10-CM | POA: Diagnosis not present

## 2021-03-06 NOTE — Patient Instructions (Signed)
DUE TO COVID-19 ONLY ONE VISITOR IS ALLOWED TO COME WITH YOU AND STAY IN THE WAITING ROOM ONLY DURING PRE OP AND PROCEDURE DAY OF SURGERY. THE 1 VISITOR  MAY VISIT WITH YOU AFTER SURGERY IN YOUR PRIVATE ROOM DURING VISITING HOURS ONLY!  YOU NEED TO HAVE A COVID 19 TEST ON: 03/13/21 @              , THIS TEST MUST BE DONE BEFORE SURGERY,  COVID TESTING SITE [place] [id number], IT IS ON THE RIGHT GOING OUT [address] APPROXIMATELY  2 MINUTES PAST [employer] ON THE RIGHT. ONCE YOUR COVID TEST IS COMPLETED,  PLEASE BEGIN THE QUARANTINE INSTRUCTIONS AS OUTLINED IN YOUR HANDOUT.                Zachary Calhoun    Your procedure is scheduled on: 03/17/21   Report to [facility] Main  Entrance   Report to admitting at: 7:00 AM     Call this number if you have problems the morning of surgery [phone]    Remember:  NO SOLID FOOD AFTER MIDNIGHT THE NIGHT PRIOR TO SURGERY. NOTHING BY MOUTH EXCEPT CLEAR LIQUIDS UNTIL: 6:30 AM . PLEASE FINISH ENSURE DRINK PER SURGEON ORDER  WHICH NEEDS TO BE COMPLETED AT: 6:30 AM .  CLEAR LIQUID DIET  Foods Allowed                                                                     Foods Excluded  Coffee and tea, regular and decaf                             liquids that you cannot  Plain Jell-O any favor except red or purple                                           see through such as: Fruit ices (not with fruit pulp)                                     milk, soups, orange juice  Iced Popsicles                                    All solid food Carbonated beverages, regular and diet                                    Cranberry, grape and apple juices Sports drinks like Gatorade Lightly seasoned clear broth or consume(fat free) Sugar, honey syrup  Sample Menu Breakfast                                Lunch  Supper Cranberry juice                    Beef broth                             Chicken broth Jell-O                                     Grape juice                           Apple juice Coffee or tea                        Jell-O                                      Popsicle                                                Coffee or tea                        Coffee or tea  _____________________________________________________________________   BRUSH YOUR TEETH MORNING OF SURGERY AND RINSE YOUR MOUTH OUT, NO CHEWING GUM CANDY OR MINTS.                                 You may not have any metal on your body including hair pins and              piercings  Do not wear jewelry, lotions, powders or perfumes, deodorant             Men may shave face and neck.   Do not bring valuables to the hospital. [place].  Contacts, dentures or bridgework may not be worn into surgery.  Leave suitcase in the car. After surgery it may be brought to your room.     Patients discharged the day of surgery will not be allowed to drive home. IF YOU ARE HAVING SURGERY AND GOING HOME THE SAME DAY, YOU MUST HAVE AN ADULT TO DRIVE YOU HOME AND BE WITH YOU FOR 24 HOURS. YOU MAY GO HOME BY TAXI OR UBER OR ORTHERWISE, BUT AN ADULT MUST ACCOMPANY YOU HOME AND STAY WITH YOU FOR 24 HOURS.  Name and phone number of your driver:  Special Instructions: N/A              Please read over the following fact sheets you were given: _____________________________________________________________________         [facility] - Preparing for Surgery Before surgery, you can play an important role.  Because skin is not sterile, your skin needs to be as free of germs as possible.  You can reduce the number of germs on your skin by washing with CHG (chlorahexidine gluconate) soap before surgery.  CHG is an antiseptic cleaner which kills germs and bonds  with the skin to continue killing germs even after washing. Please DO NOT use if you have an allergy to CHG or  antibacterial soaps.  If your skin becomes reddened/irritated stop using the CHG and inform your nurse when you arrive at Short Stay. Do not shave (including legs and underarms) for at least 48 hours prior to the first CHG shower.  You may shave your face/neck. Please follow these instructions carefully:  1.  Shower with CHG Soap the night before surgery and the  morning of Surgery.  2.  If you choose to wash your hair, wash your hair first as usual with your  normal  shampoo.  3.  After you shampoo, rinse your hair and body thoroughly to remove the  shampoo.                           4.  Use CHG as you would any other liquid soap.  You can apply chg directly  to the skin and wash                       Gently with a scrungie or clean washcloth.  5.  Apply the CHG Soap to your body ONLY FROM THE NECK DOWN.   Do not use on face/ open                           Wound or open sores. Avoid contact with eyes, ears mouth and genitals (private parts).                       Wash face,  Genitals (private parts) with your normal soap.             6.  Wash thoroughly, paying special attention to the area where your surgery  will be performed.  7.  Thoroughly rinse your body with warm water from the neck down.  8.  DO NOT shower/wash with your normal soap after using and rinsing off  the CHG Soap.                9.  Pat yourself dry with a clean towel.            10.  Wear clean pajamas.            11.  Place clean sheets on your bed the night of your first shower and do not  sleep with pets. Day of Surgery : Do not apply any lotions/deodorants the morning of surgery.  Please wear clean clothes to the hospital/surgery center.  FAILURE TO FOLLOW THESE INSTRUCTIONS MAY RESULT IN THE CANCELLATION OF YOUR SURGERY PATIENT SIGNATURE_________________________________  NURSE SIGNATURE__________________________________  ________________________________________________________________________   Zachary Calhoun  An incentive spirometer is a tool that can help keep your lungs clear and active. This tool measures how well you are filling your lungs with each breath. Taking long deep breaths may help reverse or decrease the chance of developing breathing (pulmonary) problems (especially infection) following:  A long period of time when you are unable to move or be active. BEFORE THE PROCEDURE   If the spirometer includes an indicator to show your best effort, your nurse or respiratory therapist will set it to a desired goal.  If possible, sit up straight or lean slightly forward. Try not to slouch.  Hold the incentive spirometer in an upright position. INSTRUCTIONS  FOR USE  1. Sit on the edge of your bed if possible, or sit up as far as you can in bed or on a chair. 2. Hold the incentive spirometer in an upright position. 3. Breathe out normally. 4. Place the mouthpiece in your mouth and seal your lips tightly around it. 5. Breathe in slowly and as deeply as possible, raising the piston or the ball toward the top of the column. 6. Hold your breath for 3-5 seconds or for as long as possible. Allow the piston or ball to fall to the bottom of the column. 7. Remove the mouthpiece from your mouth and breathe out normally. 8. Rest for a few seconds and repeat Steps 1 through 7 at least 10 times every 1-2 hours when you are awake. Take your time and take a few normal breaths between deep breaths. 9. The spirometer may include an indicator to show your best effort. Use the indicator as a goal to work toward during each repetition. 10. After each set of 10 deep breaths, practice coughing to be sure your lungs are clear. If you have an incision (the cut made at the time of surgery), support your incision when coughing by placing a pillow or rolled up towels firmly against it. Once you are able to get out of bed, walk around indoors and cough well. You may stop using the incentive spirometer when  instructed by your caregiver.  RISKS AND COMPLICATIONS  Take your time so you do not get dizzy or light-headed.  If you are in pain, you may need to take or ask for pain medication before doing incentive spirometry. It is harder to take a deep breath if you are having pain. AFTER USE  Rest and breathe slowly and easily.  It can be helpful to keep track of a log of your progress. Your caregiver can provide you with a simple table to help with this. If you are using the spirometer at home, follow these instructions: [place] IF:   You are having difficultly using the spirometer.  You have trouble using the spirometer as often as instructed.  Your pain medication is not giving enough relief while using the spirometer.  You develop fever of 100.5 F (38.1 C) or higher. SEEK IMMEDIATE MEDICAL CARE IF:   You cough up bloody sputum that had not been present before.  You develop fever of 102 F (38.9 C) or greater.  You develop worsening pain at or near the incision site. MAKE SURE YOU:   Understand these instructions.  Will watch your condition.  Will get help right away if you are not doing well or get worse. Document Released: 04/04/2007 Document Revised: 02/14/2012 Document Reviewed: 06/05/2007 [facility] Patient Information 2014 [place], [place].   ________________________________________________________________________

## 2021-03-06 NOTE — H&P (Signed)
HIP ARTHROPLASTY ADMISSION H&P  Patient ID: Zachary Calhoun MRN: [id number] DOB/AGE: 05/24/1941 80 y.o.  Chief Complaint: right hip pain.  Planned Procedure Date: 03-17-21 Medical Clearance by Dr. Christa See Cardiac Clearance by Tommye Standard PA-C   HPI: Zachary Calhoun is a 80 y.o. male who presents for evaluation of djd right hip. The patient has a history of pain and functional disability in the right hip due to arthritis and has failed non-surgical conservative treatments for greater than 12 weeks to include NSAID's and/or analgesics, corticosteriod injections and activity modification.  Onset of symptoms was gradual, starting 3 years ago with gradually worsening course since that time. The patient noted no past surgery on the right hip.  Patient currently rates pain at 6 out of 10 with activity. Patient has night pain, worsening of pain with activity and weight bearing and pain that interferes with activities of daily living.  Patient has evidence of subchondral sclerosis, periarticular osteophytes and joint space narrowing by imaging studies.  There is no active infection.  Past Medical History:  Diagnosis Date  . Controlled type 2 diabetes mellitus (HCC)    WITHOUT COMPLICATION, WITHOUT LONG-TERM CURRENT USE OF INSULIN  . CVA (cerebral vascular accident) ([place])    DUE TO THROMBOSIS OF LEFT CEREBELLAR ARTERY  . History of gout X 1   "left big toe"  . History of kidney stones    "no OR" (10/05/2017)  . HLD (hyperlipidemia)   . Hypertension   . Hypertriglyceridemia   . Stroke [facility])    Past Surgical History:  Procedure Laterality Date  . APPENDECTOMY    . COLONOSCOPY     DR. Lyndel Safe  . FLEXIBLE SIGMOIDOSCOPY  06/22/1993   WAS NORMAL TO 65 CMS  . INGUINAL HERNIA REPAIR Bilateral   . LOOP RECORDER INSERTION N/A 10/07/2017   Procedure: LOOP RECORDER INSERTION;  Surgeon: Constance Haw, MD;  Location: [place] CV LAB;  Service: Cardiovascular;  Laterality: N/A;  . TEE  WITHOUT CARDIOVERSION N/A 10/07/2017   Procedure: TRANSESOPHAGEAL ECHOCARDIOGRAM (TEE);  Surgeon: Dorothy Spark, MD;  Location: [facility] ENDOSCOPY;  Service: Cardiovascular;  Laterality: N/A;   Allergies  Allergen Reactions  . Hydrochlorothiazide     MALAISE AND WEAKNESS   Prior to Admission medications   Medication Sig Start Date End Date Taking? Authorizing Provider  atorvastatin (LIPITOR) 40 MG tablet Take 40 mg by mouth daily. 09/09/17  Yes [provider]  captopril (CAPOTEN) 50 MG tablet Take 50 mg by mouth 2 (two) times daily. 11/07/17  Yes [provider]  clopidogrel (PLAVIX) 75 MG tablet Take 1 tablet (75 mg total) by mouth daily. 10/08/17  Yes Patrecia Pour, Christean Grief, MD  Homeopathic Products ([place]) Apply 1 application topically daily as needed (cramps/pain).   Yes [provider]  Multiple Vitamin (MULTIVITAMIN) tablet Take 1 tablet daily by mouth.   Yes [provider]  niacin 500 MG tablet Take 500 mg daily by mouth.   Yes [provider]  Omega-3 Fatty Acids (FISH OIL) 1200 MG CAPS Take 1,200 mg by mouth daily.   Yes [provider]   Social History   Socioeconomic History  . Marital status: Married    Spouse name: Not on file  . Number of children: Not on file  . Years of education: Not on file  . Highest education level: Not on file  Occupational History  . Not on file  Tobacco Use  . Smoking status: Never Smoker  .  Smokeless tobacco: Never Used  Vaping Use  . Vaping Use: Never used  Substance and Sexual Activity  . Alcohol use: No  . Drug use: No  . Sexual activity: Yes  Other Topics Concern  . Not on file  Social History Narrative  . Not on file   Social Determinants of Health   Financial Resource Strain: Not on file  Food Insecurity: Not on file  Transportation Needs: Not on file  Physical Activity: Not on file  Stress: Not on file  Social Connections: Not on file   Family History   Problem Relation Age of Onset  . Stroke Mother     ROS: Currently denies lightheadedness, dizziness, Fever, chills, CP, SOB. No personal history of DVT, PE, MI.  Reports a h/o blood clots in his left left in 2018. Unsure if this was a CVA Has dentures All other systems have been reviewed and were otherwise currently negative with the exception of those mentioned in the HPI and as above.  Objective: Vitals: Ht: 5'8" Wt: 188.8 lbs Temp: 97.7 BP: 180/75 Pulse: 54 O2 98% on room air.   Physical Exam: General: Alert, NAD. Trendelenberg Gait  HEENT: EOMI, Good Neck Extension  Pulm: No increased work of breathing.  Clear B/L A/P w/o crackle or wheeze.  CV: RRR, No m/g/r appreciated  GI: soft, NT, ND. BS in all 4 quadrants Neuro: CN II-XII grossly intact without focal deficit.  Sensation intact distally Skin: No lesions in the area of chief complaint MSK/Surgical Site: Right Hip pain with passive ROM. + Stinchfield. +FABER. +FADIR. Decreased strength. NVI.    Imaging Review Plain radiographs demonstrate severe degenerative joint disease of the right hip.   The bone quality appears to be fair for age and reported activity level.  Preoperative templating of the joint replacement has been completed, documented, and submitted to the Operating Room personnel in order to optimize intra-operative equipment management.  Assessment: djd right hip Active Problems:   * No active hospital problems. *   Plan: Plan for Procedure(s): TOTAL HIP ARTHROPLASTY  The patient history, physical exam, clinical judgement of the provider and imaging are consistent with end stage degenerative joint disease and total joint arthroplasty is deemed medically necessary. The treatment options including medical management, injection therapy, and arthroplasty were discussed at length. The risks and benefits of Procedure(s): TOTAL HIP ARTHROPLASTY were presented and reviewed.  The risks of nonoperative treatment,  versus surgical intervention including but not limited to continued pain, aseptic loosening, stiffness, dislocation/subluxation, infection, bleeding, nerve injury, blood clots, cardiopulmonary complications, morbidity, mortality, among others were discussed. The patient verbalizes understanding and wishes to proceed with the plan.  Patient is being admitted for surgery, pain control, PT, prophylactic antibiotics, VTE prophylaxis, progressive ambulation, ADL's and discharge planning.   Dental prophylaxis discussed and recommended for 2 years postoperatively.   The patient does meet the criteria for TXA which will be used perioperatively.    ASA 81 mg BID will be used postoperatively for DVT prophylaxis in addition to SCDs, and early ambulation.  Plan for Oxycodone, Celebrex, Tylenol for pain.    Robaxin for muscle spasms.    Omeprazole for gastric protection.  The patient is planning to be discharged home with OPPT into the care of his wife Vinetta Bergamo who can be reached at [phone] and his daughter Sheral Flow who can be reached at [phone].  Follow up appt 03-27-21 at 10:45am  Necessary lab work drawn.     Britt Bottom, PA-C Office [phone]  03/06/2021 4:08 PM

## 2021-03-09 ENCOUNTER — Encounter (HOSPITAL_COMMUNITY)
Admission: RE | Admit: 2021-03-09 | Discharge: 2021-03-09 | Disposition: A | Discharge status: Home / Self Care | Payer: Medicare Other | Source: Ambulatory Visit | Attending: Orthopedic Surgery | Admitting: Orthopedic Surgery

## 2021-03-09 ENCOUNTER — Encounter (HOSPITAL_COMMUNITY): Payer: Self-pay

## 2021-03-09 ENCOUNTER — Other Ambulatory Visit: Payer: Self-pay

## 2021-03-09 DIAGNOSIS — Z01812 Encounter for preprocedural laboratory examination: Secondary | ICD-10-CM | POA: Diagnosis not present

## 2021-03-09 HISTORY — DX: Presence of automatic (implantable) cardiac defibrillator: Z95.810

## 2021-03-09 HISTORY — DX: Unspecified osteoarthritis, unspecified site: M19.90

## 2021-03-09 LAB — BASIC METABOLIC PANEL
Anion gap: 7 (ref 5–15)
BUN: 12 mg/dL (ref 8–23)
CO2: 26 mmol/L (ref 22–32)
Calcium: 9.1 mg/dL (ref 8.9–10.3)
Chloride: 105 mmol/L (ref 98–111)
Creatinine, Ser: 1.2 mg/dL (ref 0.61–1.24)
GFR, Estimated: >60 mL/min (ref >60)
Glucose, Bld: 90 mg/dL (ref 70–99)
Potassium: 4.9 mmol/L (ref 3.5–5.1)
Sodium: 138 mmol/L (ref 135–145)

## 2021-03-09 LAB — CBC
HCT: 42.1 % (ref 39.0–52.0)
Hemoglobin: 14.2 g/dL (ref 13.0–17.0)
MCH: 33.3 pg (ref 26.0–34.0)
MCHC: 33.7 g/dL (ref 30.0–36.0)
MCV: 98.6 fL (ref 80.0–100.0)
Platelets: 212 10*3/uL (ref 150–400)
RBC: 4.27 MIL/uL (ref 4.22–5.81)
RDW: 13.1 % (ref 11.5–15.5)
WBC: 5.4 10*3/uL (ref 4.0–10.5)
nRBC: 0 % (ref 0.0–0.2)

## 2021-03-09 LAB — GLUCOSE, CAPILLARY: Glucose-Capillary: 91 mg/dL (ref 70–99)

## 2021-03-09 LAB — SURGICAL PCR SCREEN
MRSA, PCR: NEGATIVE
Staphylococcus aureus: NEGATIVE

## 2021-03-09 LAB — HEMOGLOBIN A1C
Hgb A1c MFr Bld: 6 % — ABNORMAL HIGH (ref 4.8–5.6)
Mean Plasma Glucose: 125.5 mg/dL

## 2021-03-09 NOTE — Progress Notes (Signed)
COVID Vaccine Completed: Yes Date COVID Vaccine completed:08/2020 [place] COVID vaccine manufacturer:  Moderna     PCP - [facility] Cardiologist - Dr. Meredith Leeds.  LOV: 02/11/21  Chest x-ray -  EKG - 02/11/21 Stress Test -  ECHO -  Cardiac Cath -  Pacemaker/ICD device last checked: 02/02/21  Sleep Study -  CPAP -   Fasting Blood Sugar -  Checks Blood Sugar _____ times a day  Blood Thinner Instructions:Plavix: Is on hold since today(03/09/21) as per Dr. Debroah Loop orders. Aspirin Instructions: Last Dose:  Anesthesia review: Hx: HTN,CVA  Patient denies shortness of breath, fever, cough and chest pain at PAT appointment   Patient verbalized understanding of instructions that were given to them at the PAT appointment. Patient was also instructed that they will need to review over the PAT instructions again at home before surgery.

## 2021-03-10 NOTE — Care Plan (Signed)
Ortho Bundle Case Management Note  Patient Details  Name: Zachary Calhoun MRN: [id number] Date of Birth: 09-23-1941     Met with patient in the office prior to surgery. He will discharge to home with family to assist. Rolling walker ordered for home use. OPPT set up at [place]. Patient and MD in agreement with plan. Choice offered.                 DME Arranged:  [place]ford Rile rolling DME Agency:  [employer]  HH Arranged:    [place] Agency:     Additional Comments: Please contact me with any questions of if this plan should need to change.  Ladell Heads,  [place] Specialist  [phone] 03/10/2021, 10:41 AM

## 2021-03-10 NOTE — Progress Notes (Signed)
Anesthesia Chart Review   Case: [id number] Date/Time: 03/17/21 0915   Procedure: TOTAL HIP ARTHROPLASTY (Right Hip)   Anesthesia type: Choice   Pre-op diagnosis: djd right hip   Location: [facility] ROOM 08 / [facility]   Surgeons: Renette Butters, MD      DISCUSSION:80 y.o. never smoker with h/o HTN, DM II, CVA, loop recorder in place, right hip djd scheduled for above procedure 03/17/2021 with Dr. Edmonia Lynch.   Pt last seen by cardiology 02/11/2021. Per OV note pt very active, denies CP, palpitations or other CV sx. RCRI is one, 0.9% risk. "Low cardiac risk score, no cardiac contraindication to hip surgery planned plavix recommendations are deferred to his PMD." Pt reports last dose of Plavix 03/09/21.   Anticipate pt can proceed with planned procedure barring acute status change.   VS: BP (!) 144/69   Pulse (!) 54   Temp 36.9 C (Oral)   Ht 5\' 8"  (1.727 m)   Wt 73.9 kg   SpO2 97%   BMI 24.78 kg/m   PROVIDERS: Physicians, [facility], MD is Cardiologist  LABS: Labs reviewed: Acceptable for surgery. (all labs ordered are listed, but only abnormal results are displayed)  Labs Reviewed  HEMOGLOBIN A1C - Abnormal; Notable for the following components:      Result Value   Hgb A1c MFr Bld 6.0 (*)    All other components within normal limits  SURGICAL PCR SCREEN  CBC  BASIC METABOLIC PANEL  GLUCOSE, CAPILLARY     IMAGES:   EKG: 02/11/2021 Rate 54 bpm  Sinus bradycardia  CV: Echo 10/07/2017 Study Conclusions   - Left ventricle: Systolic function was normal. The estimated  ejection fraction was in the range of 55% to 60%. Wall motion was  normal; there were no regional wall motion abnormalities.  - Left atrium: The atrium was dilated. No evidence of thrombus in  the atrial cavity or appendage. No evidence of thrombus in the  atrial cavity or appendage.  - Right ventricle: Systolic function was normal.  - Right atrium: No evidence of thrombus in  the atrial cavity or  appendage.  - Atrial septum: No defect or patent foramen ovale was identified.  - Pericardium, extracardiac: There was no pericardial effusion.   Impressions:   - No cardiac source of emboli was indentified.  Negative bubble study. No PFO.  Past Medical History:  Diagnosis Date  . AICD (automatic cardioverter/defibrillator) present   . Arthritis   . Controlled type 2 diabetes mellitus (HCC)    WITHOUT COMPLICATION, WITHOUT LONG-TERM CURRENT USE OF INSULIN  . CVA (cerebral vascular accident) ([place])    DUE TO THROMBOSIS OF LEFT CEREBELLAR ARTERY  . History of gout X 1   "left big toe"  . History of kidney stones    "no OR" (10/05/2017)  . HLD (hyperlipidemia)   . Hypertension   . Hypertriglyceridemia   . Stroke [facility])     Past Surgical History:  Procedure Laterality Date  . APPENDECTOMY    . CARDIAC CATHETERIZATION    . COLONOSCOPY     DR. Lyndel Safe  . FLEXIBLE SIGMOIDOSCOPY  06/22/1993   WAS NORMAL TO 65 CMS  . INGUINAL HERNIA REPAIR Bilateral   . LOOP RECORDER INSERTION N/A 10/07/2017   Procedure: LOOP RECORDER INSERTION;  Surgeon: Constance Haw, MD;  Location: [place] CV LAB;  Service: Cardiovascular;  Laterality: N/A;  . TEE WITHOUT CARDIOVERSION N/A 10/07/2017   Procedure: TRANSESOPHAGEAL ECHOCARDIOGRAM (TEE);  Surgeon: Dorothy Spark, MD;  Location: [facility] ENDOSCOPY;  Service: Cardiovascular;  Laterality: N/A;    MEDICATIONS: . atorvastatin (LIPITOR) 40 MG tablet  . captopril (CAPOTEN) 50 MG tablet  . clopidogrel (PLAVIX) 75 MG tablet  . Homeopathic Products ([place])  . Multiple Vitamin (MULTIVITAMIN) tablet  . niacin 500 MG tablet  . Omega-3 Fatty Acids (FISH OIL) 1200 MG CAPS   . 0.9 %  sodium chloride infusion    Konrad Felix, PA-C [facility] Pre-Surgical Testing [phone]

## 2021-03-11 NOTE — Anesthesia Preprocedure Evaluation (Addendum)
Anesthesia Evaluation  Patient identified by MRN, date of birth, ID band Patient awake    Reviewed: Allergy & Precautions, NPO status , Patient's Chart, lab work & pertinent test results  Airway Mallampati: III  TM Distance: >3 FB Neck ROM: Full    Dental  (+) Edentulous Upper, Edentulous Lower   Pulmonary neg pulmonary ROS,    Pulmonary exam normal breath sounds clear to auscultation       Cardiovascular hypertension, Pt. on medications Normal cardiovascular exam Rhythm:Regular Rate:Normal  Loop recorder   Neuro/Psych CVA, No Residual Symptoms negative psych ROS   GI/Hepatic negative GI ROS, Neg liver ROS,   Endo/Other  negative endocrine ROS  Renal/GU negative Renal ROS     Musculoskeletal  (+) Arthritis , Gout   Abdominal   Peds  Hematology HLD   Anesthesia Other Findings djd right hip  Reproductive/Obstetrics                           Anesthesia Physical Anesthesia Plan  ASA: II  Anesthesia Plan: Spinal   Post-op Pain Management:    Induction:   PONV Risk Score and Plan: 1 and Ondansetron, Dexamethasone, Propofol infusion and Treatment may vary due to age or medical condition  Airway Management Planned: Simple Face Mask  Additional Equipment:   Intra-op Plan:   Post-operative Plan:   Informed Consent: I have reviewed the patients History and Physical, chart, labs and discussed the procedure including the risks, benefits and alternatives for the proposed anesthesia with the patient or authorized representative who has indicated his/her understanding and acceptance.       Plan Discussed with: CRNA  Anesthesia Plan Comments: (Reviewed PAT note 03/09/21, Konrad Felix, PA-C)      Anesthesia Quick Evaluation

## 2021-03-13 ENCOUNTER — Other Ambulatory Visit (HOSPITAL_COMMUNITY)
Admission: RE | Admit: 2021-03-13 | Discharge: 2021-03-13 | Disposition: A | Discharge status: Home / Self Care | Payer: Medicare Other | Source: Ambulatory Visit | Attending: Orthopedic Surgery | Admitting: Orthopedic Surgery

## 2021-03-13 DIAGNOSIS — Z20822 Contact with and (suspected) exposure to covid-19: Secondary | ICD-10-CM | POA: Insufficient documentation

## 2021-03-13 DIAGNOSIS — Z01812 Encounter for preprocedural laboratory examination: Secondary | ICD-10-CM | POA: Insufficient documentation

## 2021-03-13 LAB — SARS CORONAVIRUS 2 (TAT 6-24 HRS): SARS Coronavirus 2: NEGATIVE

## 2021-03-14 LAB — CUP PACEART REMOTE DEVICE CHECK
Date Time Interrogation Session: 20220403013054
Implantable Pulse Generator Implant Date: 20181102

## 2021-03-16 ENCOUNTER — Ambulatory Visit (INDEPENDENT_AMBULATORY_CARE_PROVIDER_SITE_OTHER): Payer: Medicare Other

## 2021-03-16 DIAGNOSIS — I639 Cerebral infarction, unspecified: Secondary | ICD-10-CM

## 2021-03-17 ENCOUNTER — Ambulatory Visit (HOSPITAL_COMMUNITY): Payer: Medicare Other

## 2021-03-17 ENCOUNTER — Encounter (HOSPITAL_COMMUNITY)
Admission: RE | Disposition: A | Discharge status: Home / Self Care | Payer: Self-pay | Source: Home / Self Care | Attending: Orthopedic Surgery

## 2021-03-17 ENCOUNTER — Ambulatory Visit (HOSPITAL_COMMUNITY): Payer: Medicare Other | Admitting: Anesthesiology

## 2021-03-17 ENCOUNTER — Ambulatory Visit (HOSPITAL_COMMUNITY)
Admission: RE | Admit: 2021-03-17 | Discharge: 2021-03-17 | Disposition: A | Discharge status: Home / Self Care | Payer: Medicare Other | Attending: Orthopedic Surgery | Admitting: Orthopedic Surgery

## 2021-03-17 ENCOUNTER — Encounter (HOSPITAL_COMMUNITY): Payer: Self-pay | Admitting: Orthopedic Surgery

## 2021-03-17 ENCOUNTER — Ambulatory Visit (HOSPITAL_COMMUNITY): Payer: Medicare Other | Admitting: Physician Assistant

## 2021-03-17 DIAGNOSIS — E781 Pure hyperglyceridemia: Secondary | ICD-10-CM | POA: Diagnosis not present

## 2021-03-17 DIAGNOSIS — Z8673 Personal history of transient ischemic attack (TIA), and cerebral infarction without residual deficits: Secondary | ICD-10-CM | POA: Insufficient documentation

## 2021-03-17 DIAGNOSIS — Z79899 Other long term (current) drug therapy: Secondary | ICD-10-CM | POA: Diagnosis not present

## 2021-03-17 DIAGNOSIS — Z7902 Long term (current) use of antithrombotics/antiplatelets: Secondary | ICD-10-CM | POA: Diagnosis not present

## 2021-03-17 DIAGNOSIS — K219 Gastro-esophageal reflux disease without esophagitis: Secondary | ICD-10-CM | POA: Diagnosis not present

## 2021-03-17 DIAGNOSIS — I10 Essential (primary) hypertension: Secondary | ICD-10-CM | POA: Diagnosis not present

## 2021-03-17 DIAGNOSIS — M1611 Unilateral primary osteoarthritis, right hip: Secondary | ICD-10-CM | POA: Insufficient documentation

## 2021-03-17 DIAGNOSIS — Z96641 Presence of right artificial hip joint: Secondary | ICD-10-CM

## 2021-03-17 DIAGNOSIS — Z471 Aftercare following joint replacement surgery: Secondary | ICD-10-CM | POA: Diagnosis not present

## 2021-03-17 DIAGNOSIS — Z419 Encounter for procedure for purposes other than remedying health state, unspecified: Secondary | ICD-10-CM

## 2021-03-17 DIAGNOSIS — E785 Hyperlipidemia, unspecified: Secondary | ICD-10-CM | POA: Diagnosis not present

## 2021-03-17 HISTORY — PX: TOTAL HIP ARTHROPLASTY: SHX124

## 2021-03-17 LAB — GLUCOSE, CAPILLARY: Glucose-Capillary: 95 mg/dL (ref 70–99)

## 2021-03-17 LAB — SAMPLE TO BLOOD BANK

## 2021-03-17 SURGERY — ARTHROPLASTY, HIP, TOTAL, ANTERIOR APPROACH
Anesthesia: Spinal | Site: Hip | Laterality: Right

## 2021-03-17 MED ORDER — DEXAMETHASONE SODIUM PHOSPHATE 10 MG/ML IJ SOLN: 8.0000 mg | Freq: Once | INTRAMUSCULAR | Status: DC

## 2021-03-17 MED ORDER — ACETAMINOPHEN 500 MG PO TABS: 1000.0000 mg | ORAL_TABLET | Freq: Four times a day (QID) | ORAL | 0 refills | Status: DC | PRN

## 2021-03-17 MED ORDER — BUPIVACAINE LIPOSOME 1.3 % IJ SUSP
20.0000 mL | Freq: Once | INTRAMUSCULAR | Status: AC
  Administered 2021-03-17: 20 mL
  Filled 2021-03-17: qty 20

## 2021-03-17 MED ORDER — PROPOFOL 1000 MG/100ML IV EMUL
INTRAVENOUS | Status: AC
  Filled 2021-03-17: qty 100

## 2021-03-17 MED ORDER — PROPOFOL 10 MG/ML IV BOLUS
INTRAVENOUS | Status: DC | PRN
  Administered 2021-03-17: 20 mg via INTRAVENOUS

## 2021-03-17 MED ORDER — FENTANYL CITRATE (PF) 100 MCG/2ML IJ SOLN
25.0000 ug | INTRAMUSCULAR | Status: DC | PRN
Start: 2021-03-17 — End: 2021-03-18

## 2021-03-17 MED ORDER — LACTATED RINGERS IV BOLUS
500.0000 mL | Freq: Once | INTRAVENOUS | Status: AC
  Administered 2021-03-17: 500 mL via INTRAVENOUS

## 2021-03-17 MED ORDER — CEFAZOLIN SODIUM-DEXTROSE 2-4 GM/100ML-% IV SOLN
2.0000 g | INTRAVENOUS | Status: AC
  Administered 2021-03-17: 2 g via INTRAVENOUS
  Filled 2021-03-17: qty 100

## 2021-03-17 MED ORDER — ONDANSETRON HCL 4 MG PO TABS: 4.0000 mg | ORAL_TABLET | Freq: Every day | ORAL | 0 refills | Status: DC | PRN

## 2021-03-17 MED ORDER — DEXAMETHASONE SODIUM PHOSPHATE 10 MG/ML IJ SOLN
INTRAMUSCULAR | Status: DC | PRN
  Administered 2021-03-17: 10 mg via INTRAVENOUS

## 2021-03-17 MED ORDER — CHLORHEXIDINE GLUCONATE 0.12 % MT SOLN: 15.0000 mL | Freq: Once | OROMUCOSAL | Status: AC

## 2021-03-17 MED ORDER — ONDANSETRON HCL 4 MG/2ML IJ SOLN
INTRAMUSCULAR | Status: AC
  Filled 2021-03-17: qty 2

## 2021-03-17 MED ORDER — ORAL CARE MOUTH RINSE
15.0000 mL | Freq: Once | OROMUCOSAL | Status: AC
  Administered 2021-03-17: 15 mL via OROMUCOSAL

## 2021-03-17 MED ORDER — FENTANYL CITRATE (PF) 100 MCG/2ML IJ SOLN
INTRAMUSCULAR | Status: AC
  Filled 2021-03-17: qty 2

## 2021-03-17 MED ORDER — FENTANYL CITRATE (PF) 100 MCG/2ML IJ SOLN
INTRAMUSCULAR | Status: DC | PRN
  Administered 2021-03-17: 50 ug via INTRAVENOUS

## 2021-03-17 MED ORDER — OXYCODONE HCL 5 MG PO TABS: 5.0000 mg | ORAL_TABLET | Freq: Three times a day (TID) | ORAL | 0 refills | Status: DC | PRN

## 2021-03-17 MED ORDER — ONDANSETRON HCL 4 MG/2ML IJ SOLN: 4.0000 mg | Freq: Once | INTRAMUSCULAR | Status: DC | PRN

## 2021-03-17 MED ORDER — ACETAMINOPHEN 500 MG PO TABS
1000.0000 mg | ORAL_TABLET | Freq: Once | ORAL | Status: AC
  Administered 2021-03-17: 1000 mg via ORAL
  Filled 2021-03-17: qty 2

## 2021-03-17 MED ORDER — METHOCARBAMOL 500 MG IVPB - SIMPLE MED: 500.0000 mg | Freq: Four times a day (QID) | INTRAVENOUS | Status: DC | PRN

## 2021-03-17 MED ORDER — TRANEXAMIC ACID-NACL 1000-0.7 MG/100ML-% IV SOLN
1000.0000 mg | INTRAVENOUS | Status: AC
  Administered 2021-03-17: 1000 mg via INTRAVENOUS
  Filled 2021-03-17: qty 100

## 2021-03-17 MED ORDER — DEXAMETHASONE SODIUM PHOSPHATE 10 MG/ML IJ SOLN
INTRAMUSCULAR | Status: AC
  Filled 2021-03-17: qty 1

## 2021-03-17 MED ORDER — METHOCARBAMOL 500 MG PO TABS: 500.0000 mg | ORAL_TABLET | Freq: Four times a day (QID) | ORAL | Status: DC | PRN

## 2021-03-17 MED ORDER — PROPOFOL 500 MG/50ML IV EMUL
INTRAVENOUS | Status: DC | PRN
  Administered 2021-03-17: 75 ug/kg/min via INTRAVENOUS

## 2021-03-17 MED ORDER — LACTATED RINGERS IV SOLN: INTRAVENOUS | Status: DC

## 2021-03-17 MED ORDER — ONDANSETRON HCL 4 MG/2ML IJ SOLN
INTRAMUSCULAR | Status: DC | PRN
  Administered 2021-03-17: 4 mg via INTRAVENOUS

## 2021-03-17 MED ORDER — LACTATED RINGERS IV BOLUS: 250.0000 mL | Freq: Once | INTRAVENOUS | Status: DC

## 2021-03-17 MED ORDER — PHENYLEPHRINE HCL (PRESSORS) 10 MG/ML IV SOLN
INTRAVENOUS | Status: AC
  Filled 2021-03-17: qty 1

## 2021-03-17 MED ORDER — SODIUM CHLORIDE FLUSH 0.9 % IV SOLN
INTRAVENOUS | Status: DC | PRN
  Administered 2021-03-17: 20 mL

## 2021-03-17 MED ORDER — PHENYLEPHRINE HCL-NACL 10-0.9 MG/250ML-% IV SOLN
INTRAVENOUS | Status: DC | PRN
  Administered 2021-03-17: 40 ug/min via INTRAVENOUS

## 2021-03-17 MED ORDER — BUPIVACAINE IN DEXTROSE 0.75-8.25 % IT SOLN
INTRATHECAL | Status: DC | PRN
  Administered 2021-03-17: 2 mL via INTRATHECAL

## 2021-03-17 MED ORDER — CEFAZOLIN SODIUM-DEXTROSE 1-4 GM/50ML-% IV SOLN: 1.0000 g | Freq: Four times a day (QID) | INTRAVENOUS | Status: DC

## 2021-03-17 MED ORDER — SODIUM CHLORIDE (PF) 0.9 % IJ SOLN
INTRAMUSCULAR | Status: AC
  Filled 2021-03-17: qty 20

## 2021-03-17 MED ORDER — MELOXICAM 15 MG PO TABS: 15.0000 mg | ORAL_TABLET | Freq: Every day | ORAL | 0 refills | Status: DC

## 2021-03-17 MED ORDER — TRANEXAMIC ACID-NACL 1000-0.7 MG/100ML-% IV SOLN
INTRAVENOUS | Status: AC
  Filled 2021-03-17: qty 100

## 2021-03-17 MED ORDER — TRANEXAMIC ACID-NACL 1000-0.7 MG/100ML-% IV SOLN
1000.0000 mg | Freq: Once | INTRAVENOUS | Status: AC
  Administered 2021-03-17: 1000 mg via INTRAVENOUS

## 2021-03-17 MED ORDER — GABAPENTIN 300 MG PO CAPS
300.0000 mg | ORAL_CAPSULE | Freq: Once | ORAL | Status: AC
  Administered 2021-03-17: 300 mg via ORAL
  Filled 2021-03-17: qty 1

## 2021-03-17 MED ORDER — METHOCARBAMOL 500 MG PO TABS: 500.0000 mg | ORAL_TABLET | Freq: Three times a day (TID) | ORAL | 0 refills | Status: DC | PRN

## 2021-03-17 MED ORDER — ASPIRIN EC 81 MG PO TBEC: 81.0000 mg | DELAYED_RELEASE_TABLET | Freq: Two times a day (BID) | ORAL | 0 refills | Status: DC

## 2021-03-17 MED ORDER — 0.9 % SODIUM CHLORIDE (POUR BTL) OPTIME
TOPICAL | Status: DC | PRN
  Administered 2021-03-17: 1000 mL

## 2021-03-17 MED ORDER — OMEPRAZOLE MAGNESIUM 20 MG PO TBEC: 20.0000 mg | DELAYED_RELEASE_TABLET | Freq: Every day | ORAL | 0 refills | Status: DC

## 2021-03-17 MED ORDER — AMISULPRIDE (ANTIEMETIC) 5 MG/2ML IV SOLN: 10.0000 mg | Freq: Once | INTRAVENOUS | Status: DC | PRN

## 2021-03-17 SURGICAL SUPPLY — 37 items
APL PRP STRL LF DISP 70% ISPRP (MISCELLANEOUS) ×1
BLADE SAG 18X100X1.27 (BLADE) ×2 IMPLANT
CHLORAPREP W/TINT 26 (MISCELLANEOUS) ×2 IMPLANT
CLSR STERI-STRIP ANTIMIC 1/2X4 (GAUZE/BANDAGES/DRESSINGS) ×1 IMPLANT
COVER PERINEAL POST (MISCELLANEOUS) ×2 IMPLANT
COVER SURGICAL LIGHT HANDLE (MISCELLANEOUS) ×2 IMPLANT
DRAPE IMP U-DRAPE 54X76 (DRAPES) ×2 IMPLANT
DRAPE STERI IOBAN 125X83 (DRAPES) ×2 IMPLANT
DRAPE U-SHAPE 47X51 STRL (DRAPES) ×4 IMPLANT
DRSG MEPILEX BORDER 4X8 (GAUZE/BANDAGES/DRESSINGS) ×1 IMPLANT
ELECT REM PT RETURN 15FT ADLT (MISCELLANEOUS) ×2 IMPLANT
GLOVE SRG 8 PF TXTR STRL LF DI (GLOVE) ×1 IMPLANT
GLOVE SURG ENC MOIS LTX SZ7.5 (GLOVE) ×4 IMPLANT
GLOVE SURG UNDER POLY LF SZ7.5 (GLOVE) ×2 IMPLANT
GLOVE SURG UNDER POLY LF SZ8 (GLOVE) ×2
GOWN STRL REUS W/TWL LRG LVL3 (GOWN DISPOSABLE) ×2 IMPLANT
GOWN STRL REUS W/TWL XL LVL3 (GOWN DISPOSABLE) ×2 IMPLANT
HEAD BIOLOX HIP 36/-2.5 (Joint) IMPLANT
HIP BIOLOX HD 36/-2.5 (Joint) ×2 IMPLANT
INSERT TRIDENT POLY 36 0DEG (Insert) ×1 IMPLANT
KIT TURNOVER KIT A (KITS) ×2 IMPLANT
MANIFOLD NEPTUNE II (INSTRUMENTS) ×2 IMPLANT
NS IRRIG 1000ML POUR BTL (IV SOLUTION) ×2 IMPLANT
PACK ANTERIOR HIP CUSTOM (KITS) ×2 IMPLANT
SCREW HEX LP 6.5X20 (Screw) ×1 IMPLANT
SHELL CLUSTERHOLE ACETABULAR 5 (Shell) ×1 IMPLANT
STEM HIP 127 DEG (Stem) ×1 IMPLANT
SUT MNCRL AB 3-0 PS2 18 (SUTURE) ×2 IMPLANT
SUT STRATAFIX 0 PDS 27 VIOLET (SUTURE) ×2
SUT VIC AB 0 CT1 36 (SUTURE) ×2 IMPLANT
SUT VIC AB 1 CT1 36 (SUTURE) ×2 IMPLANT
SUT VIC AB 2-0 CT1 27 (SUTURE) ×4
SUT VIC AB 2-0 CT1 TAPERPNT 27 (SUTURE) ×2 IMPLANT
SUTURE STRATFX 0 PDS 27 VIOLET (SUTURE) ×1 IMPLANT
TRAY FOLEY MTR SLVR 14FR STAT (SET/KITS/TRAYS/PACK) ×1 IMPLANT
TUBE SUCTION HIGH CAP CLEAR NV (SUCTIONS) ×2 IMPLANT
WATER STERILE IRR 1000ML POUR (IV SOLUTION) ×4 IMPLANT

## 2021-03-17 NOTE — Anesthesia Procedure Notes (Signed)
Spinal  Patient location during procedure: OR Start time: 03/17/2021 12:00 PM End time: 03/17/2021 12:15 PM Reason for block: surgical anesthesia Staffing Performed: anesthesiologist  Anesthesiologist: Murvin Natal, MD Preanesthetic Checklist Completed: patient identified, IV checked, risks and benefits discussed, surgical consent, monitors and equipment checked, pre-op evaluation and timeout performed Spinal Block Patient position: sitting Prep: DuraPrep Patient monitoring: cardiac monitor, continuous pulse ox and blood pressure Approach: midline Location: L4-5 Injection technique: single-shot Needle Needle type: Pencan  Needle gauge: 24 G Needle length: 9 cm Assessment Sensory level: T10 Events: CSF return and second provider Additional Notes Functioning IV was confirmed and monitors were applied. Sterile prep and drape, including hand hygiene and sterile gloves were used. The patient was positioned and the spine was prepped. Previous unsuccessful attempts by CRNA. The skin was anesthetized with lidocaine.  Free flow of clear CSF was obtained prior to injecting local anesthetic into the CSF.  The spinal needle aspirated freely following injection.  The needle was carefully withdrawn.  The patient tolerated the procedure well.

## 2021-03-17 NOTE — Progress Notes (Signed)
Physical Therapy Treatment Patient Details Name: Zachary Calhoun MRN: [id number] DOB: 1941-07-25 Today's Date: 03/17/2021    History of Present Illness 80 yo male s/p R THA direct anterior 03/17/2021. PMH: HTN, and HLD.    PT Comments    Pt tolerated mobility well this second session. Reviewed exercise program and mobiliy progression with pt and family ( wife and dtr present) Pt's spinal has resolved except a small portionof his R glut area. Ambulated well, however BPS still high and after ambulation was at 185/103. Nurse aware. Pt stated he did not take his BP medicine today due to surgery. If pt stays, we will continue to see him n the morning, however he is safe to DC home from our mobility standpoint if needed to do so. I will leave the medical decision up to the nurse and MD.    Follow Up Recommendations  Follow surgeon's recommendation for DC plan and follow-up therapies     Equipment Recommendations  Rolling walker with 5" wheels    Recommendations for Other Services       Precautions / Restrictions Precautions Precautions: None Restrictions Other Position/Activity Restrictions: WBAT    Mobility  Bed Mobility Overal bed mobility: Needs Assistance Bed Mobility: Supine to Sit;Sit to Supine     Supine to sit: Supervision Sit to supine: Supervision        Transfers Overall transfer level: Needs assistance Equipment used: Rolling walker (2 wheeled) Transfers: Sit to/from Stand Sit to Stand: Min guard         General transfer comment: tolerated well  Ambulation/Gait Ambulation/Gait assistance: Min guard (not assessed at this time due to spinal not worn off and reports of dizziness with high blood pressure) Gait Distance (Feet): 60 Feet Assistive device: Rolling walker (2 wheeled) Gait Pattern/deviations: Step-through pattern     General Gait Details: tolearted ambulation well today, reported no dizziness, no numbness except for a little still in the R glut  area   Stairs             Wheelchair Mobility    Modified Rankin (Stroke Patients Only)       Balance Overall balance assessment: Needs assistance Sitting-balance support: Bilateral upper extremity supported;Feet supported Sitting balance-Leahy Scale: Good     Standing balance support: Bilateral upper extremity supported;During functional activity Standing balance-Leahy Scale: Poor                              Cognition Arousal/Alertness: Awake/alert Behavior During Therapy: WFL for tasks assessed/performed Overall Cognitive Status: Within Functional Limits for tasks assessed                                        Exercises Total Joint Exercises Ankle Circles/Pumps: AROM;Both;10 reps;Supine Quad Sets: AROM;Supine;Both;10 reps Heel Slides: AAROM;Supine;10 reps;Right Hip ABduction/ADduction: AAROM;Supine;Right;10 reps Straight Leg Raises: AAROM;Supine;Right;10 reps    General Comments        Pertinent Vitals/Pain Pain Assessment: 0-10 Pain Score: 3  Pain Location: R hip flexor Pain Descriptors / Indicators: Sore Pain Intervention(s): Limited activity within patient's tolerance;Monitored during session;Ice applied    Home Living Family/patient expects to be discharged to:: Private residence Living Arrangements: Spouse/significant other Available Help at Discharge: Family (dtr also to assist at home ( other dtr is a MD at [facility] hospital)) Type of Home: House Home Access: Level entry  Home Layout: One level Home Equipment: Cane - single point      Prior Function Level of Independence: Independent with assistive device(s)      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 He recently uses a cane in the morning especialli due to stiffness, then can walk without it .   PT Goals (current goals can now be found in the care plan section) Acute Rehab PT Goals Patient Stated Goal: I want to go home PT  Goal Formulation: With patient Time For Goal Achievement: 03/24/21 Potential to Achieve Goals: Good Progress towards PT goals: Progressing toward goals    Frequency    7X/week      PT Plan      Co-evaluation              AM-PAC PT "6 Clicks" Mobility   Outcome Measure  Help needed turning from your back to your side while in a flat bed without using bedrails?: None Help needed moving from lying on your back to sitting on the side of a flat bed without using bedrails?: None Help needed moving to and from a bed to a chair (including a wheelchair)?: A Little Help needed standing up from a chair using your arms (e.g., wheelchair or bedside chair)?: A Little Help needed to walk in hospital room?: A Little Help needed climbing 3-5 steps with a railing? : A Little 6 Click Score: 20    End of Session Equipment Utilized During Treatment: Gait belt Activity Tolerance: Patient tolerated treatment well Patient left: in bed;with call bell/phone within reach;with nursing/sitter in room;with family/visitor present Nurse Communication: Mobility status PT Visit Diagnosis: Other abnormalities of gait and mobility (R26.89)     Time: 1314-3888 PT Time Calculation (min) (ACUTE ONLY): 34 min  Charges:  $Gait Training: 8-22 mins $Therapeutic Exercise: 8-22 mins $Therapeutic Activity: 8-22 mins                     Siri Buege, PT, MPT Acute Rehabilitation Services Office: [phone] Pager: [phone] 03/17/2021    Clide Dales 03/17/2021, 6:50 PM

## 2021-03-17 NOTE — Discharge Instructions (Signed)
POST-OPERATIVE OPIOID TAPER INSTRUCTIONS: . It is important to wean off of your opioid medication as soon as possible. If you do not need pain medication after your surgery it is ok to stop day one. . Opioids include: o Codeine, Hydrocodone(Norco, Vicodin), Oxycodone(Percocet, oxycontin) and hydromorphone amongst others.  . Long term and even short term use of opiods can cause: o Increased pain response o Dependence o Constipation o Depression o Respiratory depression o And more.  . Withdrawal symptoms can include o Flu like symptoms o Nausea, vomiting o And more . Techniques to manage these symptoms o Hydrate well o Eat regular healthy meals o Stay active o Use relaxation techniques(deep breathing, meditating, yoga) . Do Not substitute Alcohol to help with tapering . If you have been on opioids for less than two weeks and do not have pain than it is ok to stop all together.  . Plan to wean off of opioids o This plan should start within one week post op of your joint replacement. o Maintain the same interval or time between taking each dose and first decrease the dose.  o Cut the total daily intake of opioids by one tablet each day o Next start to increase the time between doses. o The last dose that should be eliminated is the evening dose.    

## 2021-03-17 NOTE — Interval H&P Note (Signed)
History and Physical Interval Note:  03/17/2021 6:55 AM  Zachary Calhoun  has presented today for surgery, with the diagnosis of djd right hip.  The various methods of treatment have been discussed with the patient and family. After consideration of risks, benefits and other options for treatment, the patient has consented to  Procedure(s): TOTAL HIP ARTHROPLASTY ANTERIOR APPROACH (Right) as a surgical intervention.  The patient's history has been reviewed, patient examined, no change in status, stable for surgery.  I have reviewed the patient's chart and labs.  Questions were answered to the patient's satisfaction.     Renette Butters

## 2021-03-17 NOTE — Interval H&P Note (Signed)
History and Physical Interval Note:  03/17/2021 10:45 AM  Zachary Calhoun  has presented today for surgery, with the diagnosis of djd right hip.  The various methods of treatment have been discussed with the patient and family. After consideration of risks, benefits and other options for treatment, the patient has consented to  Procedure(s): TOTAL HIP ARTHROPLASTY ANTERIOR APPROACH (Right) as a surgical intervention.  The patient's history has been reviewed, patient examined, no change in status, stable for surgery.  I have reviewed the patient's chart and labs.  Questions were answered to the patient's satisfaction.     Renette Butters

## 2021-03-17 NOTE — Anesthesia Procedure Notes (Signed)
Procedure Name: MAC Date/Time: 03/17/2021 12:30 PM Performed by: Claudia Desanctis, CRNA Pre-anesthesia Checklist: Patient identified, Emergency Drugs available, Suction available and Patient being monitored Patient Re-evaluated:Patient Re-evaluated prior to induction Oxygen Delivery Method: Simple face mask

## 2021-03-17 NOTE — Evaluation (Signed)
Physical Therapy Evaluation Patient Details Name: Zachary Calhoun MRN: [id number] DOB: 20-Jun-1941 Today's Date: 03/17/2021   History of Present Illness  80 yo male s/p R THA direct anterior 03/17/2021. PMH: HTN, and HLD.  Clinical Impression  Pt moving fairly well today with assessment in PACU earlier at 3:30 pm. However still at this time had numbness upon standing, was able to urinate while standing but was dizzy and BPS were high. 175/80. Will wait and return to assess patient again after spinal wears off a little more.     Follow Up Recommendations Follow surgeon's recommendation for DC plan and follow-up therapies (pt already has OPPT set up for this Thursday)    Equipment Recommendations  Rolling walker with 5" wheels (note states will get RW from [facility] while in hospital)    Recommendations for Other Services       Precautions / Restrictions Precautions Precautions: None Restrictions Other Position/Activity Restrictions: WBAT      Mobility  Bed Mobility Overal bed mobility: Needs Assistance Bed Mobility: Supine to Sit;Sit to Supine     Supine to sit: Min guard Sit to supine: Min guard        Transfers Overall transfer level: Needs assistance Equipment used: Rolling walker (2 wheeled) Transfers: Sit to/from Stand Sit to Stand: Mod assist         General transfer comment: initally was off balance with forward lean due to numbness in R glut area and also reported some dizziness  Ambulation/Gait Ambulation/Gait assistance:  (not assessed at this time due to spinal not worn off and reports of dizziness with high blood pressure)              Stairs            Wheelchair Mobility    Modified Rankin (Stroke Patients Only)       Balance Overall balance assessment: Needs assistance Sitting-balance support: Bilateral upper extremity supported;Feet supported Sitting balance-Leahy Scale: Good     Standing balance support: Bilateral upper  extremity supported;During functional activity Standing balance-Leahy Scale: Poor                               Pertinent Vitals/Pain Pain Assessment: 0-10 Pain Score: 1  Pain Location: R hip , but still feels numb Pain Descriptors / Indicators: Sore Pain Intervention(s): Limited activity within patient's tolerance;Monitored during session    Home Living Family/patient expects to be discharged to:: Private residence Living Arrangements: Spouse/significant other Available Help at Discharge: Family (dtr also to assist at home ( other dtr is a MD at [facility] hospital)) Type of Home: House Home Access: Level entry     Home Layout: One level Home Equipment: Cane - single point      Prior Function Level of Independence: Independent with assistive device(s)         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 He recently uses a cane in the morning especialli due to stiffness, then can walk without it .     Hand Dominance        Extremity/Trunk Assessment        Lower Extremity Assessment Lower Extremity Assessment: Overall WFL for tasks assessed (but limited with R Le due to still numb from spinal block)       Communication   Communication: No difficulties  Cognition Arousal/Alertness: Awake/alert Behavior During Therapy: WFL for tasks assessed/performed Overall Cognitive  Status: Within Functional Limits for tasks assessed                                        General Comments      Exercises Total Joint Exercises Ankle Circles/Pumps: AROM;Both;10 reps;Supine Quad Sets: AROM;Supine;Both;10 reps Heel Slides: AAROM;Supine;10 reps;Right Hip ABduction/ADduction: AAROM;Supine;Right;10 reps Straight Leg Raises: AAROM;Supine;Right;10 reps   Assessment/Plan    PT Assessment Patient needs continued PT services  PT Problem List Decreased strength;Decreased mobility;Decreased range of motion;Decreased activity  tolerance;Decreased balance       PT Treatment Interventions DME instruction;Gait training;Therapeutic exercise;Balance training;Functional mobility training;Therapeutic activities;Patient/family education    PT Goals (Current goals can be found in the Care Plan section)  Acute Rehab PT Goals Patient Stated Goal: I want to go home PT Goal Formulation: With patient Time For Goal Achievement: 03/24/21 Potential to Achieve Goals: Good    Frequency 7X/week   Barriers to discharge        Co-evaluation               AM-PAC PT "6 Clicks" Mobility  Outcome Measure Help needed turning from your back to your side while in a flat bed without using bedrails?: A Little Help needed moving from lying on your back to sitting on the side of a flat bed without using bedrails?: A Little Help needed moving to and from a bed to a chair (including a wheelchair)?: A Little Help needed standing up from a chair using your arms (e.g., wheelchair or bedside chair)?: A Little Help needed to walk in hospital room?: A Little Help needed climbing 3-5 steps with a railing? : A Little 6 Click Score: 18    End of Session Equipment Utilized During Treatment: Gait belt Activity Tolerance: Patient tolerated treatment well Patient left: in bed;with call bell/phone within reach;with nursing/sitter in room Nurse Communication: Mobility status PT Visit Diagnosis: Other abnormalities of gait and mobility (R26.89)    Time: 1530-1620 PT Time Calculation (min) (ACUTE ONLY): 50 min   Charges:   PT Evaluation $PT Eval Low Complexity: 1 Low PT Treatments $Therapeutic Exercise: 8-22 mins $Therapeutic Activity: 8-22 mins        Corley Kohls, PT, MPT Acute Rehabilitation Services Office: [phone] Pager: [phone] 03/17/2021   Clide Dales 03/17/2021, 6:40 PM

## 2021-03-17 NOTE — Op Note (Signed)
03/17/2021  12:18 PM  PATIENT:  Zachary Calhoun   MRN: [id number]  PRE-OPERATIVE DIAGNOSIS:  DEGENERATIVE JOINT DISEASE RIGHT HIP  POST-OPERATIVE DIAGNOSIS:  DEGENERATIVE JOINT DISEASE RIGHT HIP  PROCEDURE:  Procedure(s): TOTAL HIP ARTHROPLASTY ANTERIOR APPROACH  PREOPERATIVE INDICATIONS:    [name] is an 80 y.o. male who has a diagnosis of <principal problem not specified> and elected for surgical management after failing conservative treatment.  The risks benefits and alternatives were discussed with the patient including but not limited to the risks of nonoperative treatment, versus surgical intervention including infection, bleeding, nerve injury, periprosthetic fracture, the need for revision surgery, dislocation, leg length discrepancy, blood clots, cardiopulmonary complications, morbidity, mortality, among others, and they were willing to proceed.     OPERATIVE REPORT     SURGEON:   Renette Butters, MD    ASSISTANT:  Aggie Moats, PA-C, he was present and scrubbed throughout the case, critical for completion in a timely fashion, and for retraction, instrumentation, and closure.     ANESTHESIA:  General    COMPLICATIONS:  None.     COMPONENTS:  Stryker acolade fit femur size 5 with a 36 mm -2.5 head ball and an acetabular shell size 52 with a  polyethylene liner    PROCEDURE IN DETAIL:   The patient was met in the holding area and  identified.  The appropriate hip was identified and marked at the operative site.  The patient was then transported to the OR  and  placed under anesthesia per that record.  At that point, the patient was  placed in the supine position and  secured to the operating room table and all bony prominences padded. He received pre-operative antibiotics    The operative lower extremity was prepped from the iliac crest to the distal leg.  Sterile draping was performed.  Time out was performed prior to incision.      Skin incision was made just 2 cm  lateral to the ASIS  extending in line with the tensor fascia lata. Electrocautery was used to control all bleeders. I dissected down sharply to the fascia of the tensor fascia lata was confirmed that the muscle fibers beneath were running posteriorly. I then incised the fascia over the superficial tensor fascia lata in line with the incision. The fascia was elevated off the anterior aspect of the muscle the muscle was retracted posteriorly and protected throughout the case. I then used electrocautery to incise the tensor fascia lata fascia control and all bleeders. Immediately visible was the fat over top of the anterior neck and capsule.  I removed the anterior fat from the capsule and elevated the rectus muscle off of the anterior capsule. I then removed a large time of capsule. The retractors were then placed over the anterior acetabulum as well as around the superior and inferior neck.  I then made a femoral neck cut. Then used the power corkscrew to remove the femoral head from the acetabulum and thoroughly irrigated the acetabulum. I sized the femoral head.    I then exposed the deep acetabulum, cleared out any tissue including the ligamentum teres.   After adequate visualization, I excised the labrum, and then sequentially reamed.  I then impacted the acetabular implant into place using fluoroscopy for guidance.  Appropriate version and inclination was confirmed clinically matching their bony anatomy, and with fluoroscopy.  I placed a 20 mm screw in the posterior/superio position with an excellent bite.    I then placed  the polyethylene liner in place  I then adducted the leg and released the external rotators from the posterior femur allowing it to be easily delivered up lateral and anterior to the acetabulum for preparation of the femoral canal.    I then prepared the proximal femur using the cookie-cutter and then sequentially reamed and broached.  A trial broach, neck, and head was  utilized, and I reduced the hip and used floroscopy to assess the neck length and femoral implant.  I then impacted the femoral prosthesis into place into the appropriate version. The hip was then reduced and fluoroscopy confirmed appropriate position. Leg lengths were restored.  I then irrigated the hip copiously again with, and repaired the fascia with Vicryl, followed by monocryl for the subcutaneous tissue, Monocryl for the skin, Steri-Strips and sterile gauze. The patient was then awakened and returned to PACU in stable and satisfactory condition. There were no complications.  POST OPERATIVE PLAN: WBAT, DVT px: SCD's/TED, ambulation and chemical dvt px  Edmonia Lynch, MD Orthopedic Surgeon [phone]

## 2021-03-17 NOTE — Transfer of Care (Signed)
Immediate Anesthesia Transfer of Care Note  Patient: Zachary Calhoun  Procedure(s) Performed: TOTAL HIP ARTHROPLASTY ANTERIOR APPROACH (Right Hip)  Patient Location: PACU  Anesthesia Type:Spinal  Level of Consciousness: drowsy  Airway & Oxygen Therapy: Patient Spontanous Breathing and Patient connected to face mask  Post-op Assessment: Report given to RN and Post -op Vital signs reviewed and stable  Post vital signs: Reviewed and stable  Last Vitals:  Vitals Value Taken Time  BP    Temp    Pulse    Resp    SpO2      Last Pain:  Vitals:   03/17/21 0811  TempSrc: Oral  PainSc: 3       Patients Stated Pain Goal: 3 (38/25/05 3976)  Complications: No complications documented.

## 2021-03-18 ENCOUNTER — Encounter (HOSPITAL_COMMUNITY): Payer: Self-pay | Admitting: Orthopedic Surgery

## 2021-03-18 NOTE — Anesthesia Postprocedure Evaluation (Signed)
Anesthesia Post Note  Patient: THORNTON DOHRMANN  Procedure(s) Performed: TOTAL HIP ARTHROPLASTY ANTERIOR APPROACH (Right Hip)     Patient location during evaluation: PACU Anesthesia Type: Spinal Level of consciousness: awake Pain management: pain level controlled Vital Signs Assessment: post-procedure vital signs reviewed and stable Respiratory status: spontaneous breathing, respiratory function stable and patient connected to nasal cannula oxygen Cardiovascular status: blood pressure returned to baseline and stable Postop Assessment: no headache, no backache and no apparent nausea or vomiting Anesthetic complications: no   No complications documented.  Last Vitals:  Vitals:   03/17/21 1829 03/17/21 1844  BP: (!) 165/81   Pulse: (!) 55   Resp: 14   Temp: (!) 36.4 C   SpO2: 100% 100%    Last Pain:  Vitals:   03/17/21 1829  TempSrc:   PainSc: 0-No pain                 Gwynn Chalker P Smith [place]

## 2021-03-19 DIAGNOSIS — Z96641 Presence of right artificial hip joint: Secondary | ICD-10-CM | POA: Diagnosis not present

## 2021-03-19 DIAGNOSIS — M1611 Unilateral primary osteoarthritis, right hip: Secondary | ICD-10-CM | POA: Diagnosis not present

## 2021-03-19 DIAGNOSIS — M25551 Pain in right hip: Secondary | ICD-10-CM | POA: Diagnosis not present

## 2021-03-24 DIAGNOSIS — M1611 Unilateral primary osteoarthritis, right hip: Secondary | ICD-10-CM | POA: Diagnosis not present

## 2021-03-24 DIAGNOSIS — M25551 Pain in right hip: Secondary | ICD-10-CM | POA: Diagnosis not present

## 2021-03-24 DIAGNOSIS — Z96641 Presence of right artificial hip joint: Secondary | ICD-10-CM | POA: Diagnosis not present

## 2021-03-26 DIAGNOSIS — Z96641 Presence of right artificial hip joint: Secondary | ICD-10-CM | POA: Diagnosis not present

## 2021-03-26 DIAGNOSIS — M25551 Pain in right hip: Secondary | ICD-10-CM | POA: Diagnosis not present

## 2021-03-26 DIAGNOSIS — M1611 Unilateral primary osteoarthritis, right hip: Secondary | ICD-10-CM | POA: Diagnosis not present

## 2021-03-27 DIAGNOSIS — M1611 Unilateral primary osteoarthritis, right hip: Secondary | ICD-10-CM | POA: Diagnosis not present

## 2021-03-30 DIAGNOSIS — Z96641 Presence of right artificial hip joint: Secondary | ICD-10-CM | POA: Diagnosis not present

## 2021-03-30 DIAGNOSIS — M25551 Pain in right hip: Secondary | ICD-10-CM | POA: Diagnosis not present

## 2021-03-30 DIAGNOSIS — M1611 Unilateral primary osteoarthritis, right hip: Secondary | ICD-10-CM | POA: Diagnosis not present

## 2021-03-31 NOTE — Progress Notes (Signed)
[facility] Summary Report / Loop Recorder 

## 2021-04-03 DIAGNOSIS — M1611 Unilateral primary osteoarthritis, right hip: Secondary | ICD-10-CM | POA: Diagnosis not present

## 2021-04-03 DIAGNOSIS — M25551 Pain in right hip: Secondary | ICD-10-CM | POA: Diagnosis not present

## 2021-04-03 DIAGNOSIS — Z96641 Presence of right artificial hip joint: Secondary | ICD-10-CM | POA: Diagnosis not present

## 2021-04-06 DIAGNOSIS — Z96641 Presence of right artificial hip joint: Secondary | ICD-10-CM | POA: Diagnosis not present

## 2021-04-06 DIAGNOSIS — M1611 Unilateral primary osteoarthritis, right hip: Secondary | ICD-10-CM | POA: Diagnosis not present

## 2021-04-06 DIAGNOSIS — M25551 Pain in right hip: Secondary | ICD-10-CM | POA: Diagnosis not present

## 2021-04-20 ENCOUNTER — Ambulatory Visit (INDEPENDENT_AMBULATORY_CARE_PROVIDER_SITE_OTHER): Payer: Medicare Other

## 2021-04-20 DIAGNOSIS — I639 Cerebral infarction, unspecified: Secondary | ICD-10-CM

## 2021-04-20 LAB — CUP PACEART REMOTE DEVICE CHECK
Date Time Interrogation Session: 20220514231048
Implantable Pulse Generator Implant Date: 20181102

## 2021-04-20 IMAGING — RF DG C-ARM 1-60 MIN-NO REPORT
1 series · 2 of 2 positions shown · non-contrast
Comparison: None.

CLINICAL DATA: Right total hip arthroplasty.

EXAM:
OPERATIVE RIGHT HIP (WITH PELVIS IF PERFORMED)
TECHNIQUE: Fluoroscopic spot image(s) were submitted for interpretation
post-operatively.

[Series 1: unknown protocol · 0.20mm/px · 2 of 2 slices shown]
[im 1/2]
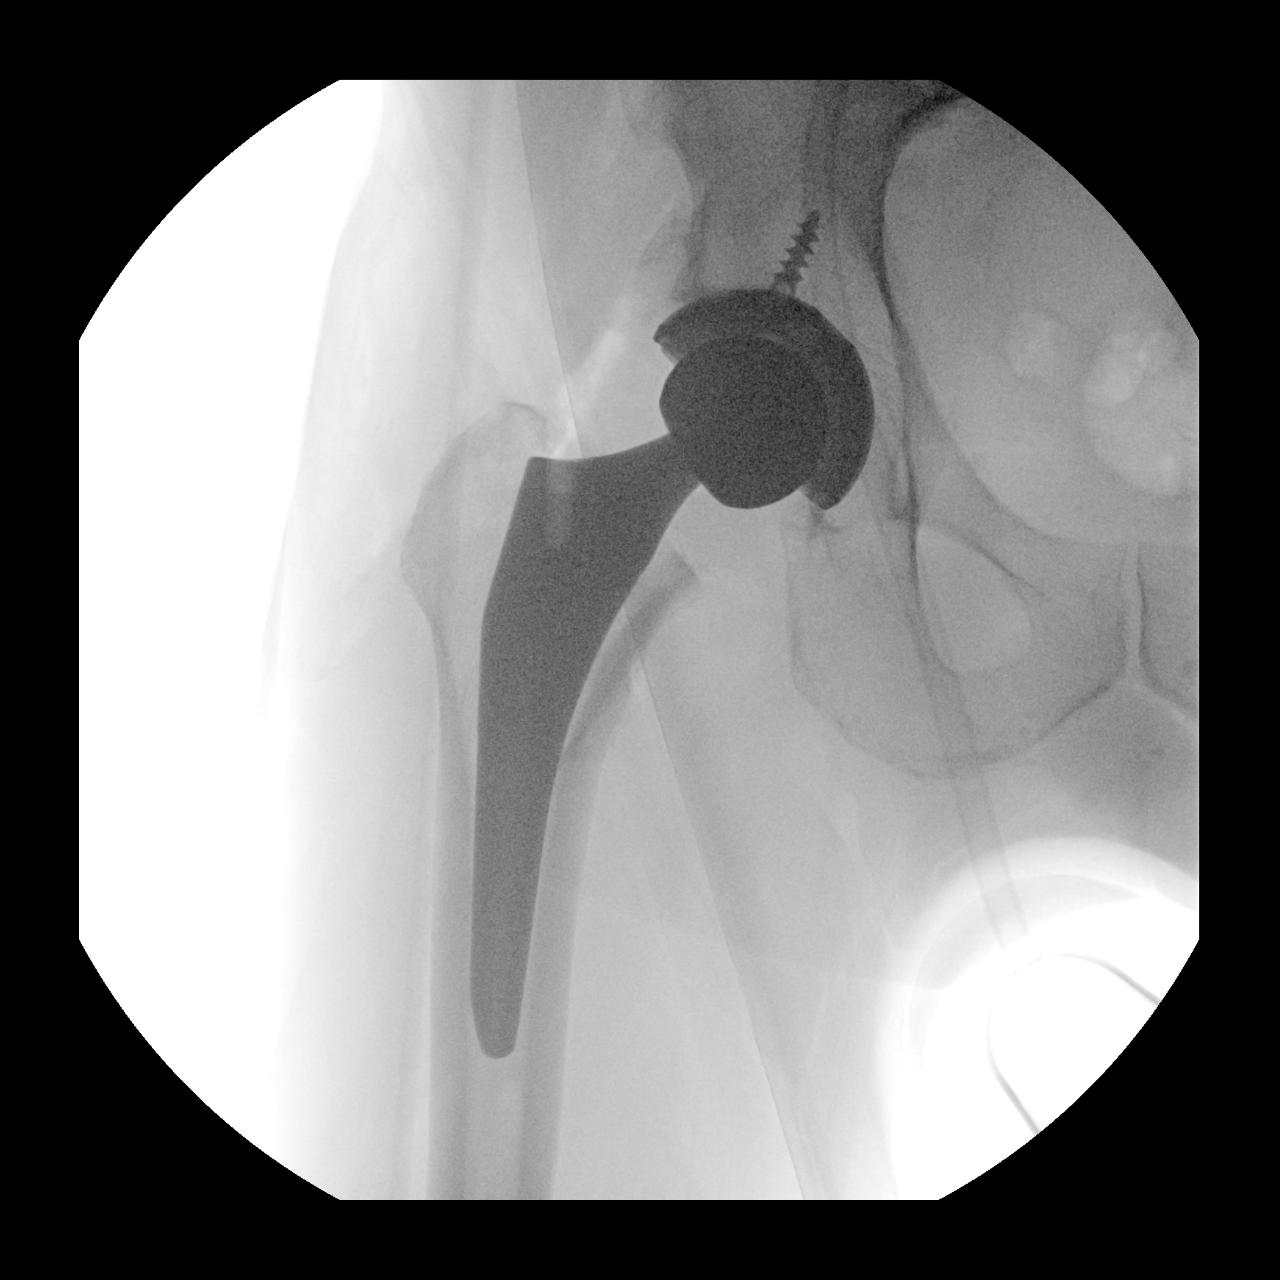
[im 2/2]
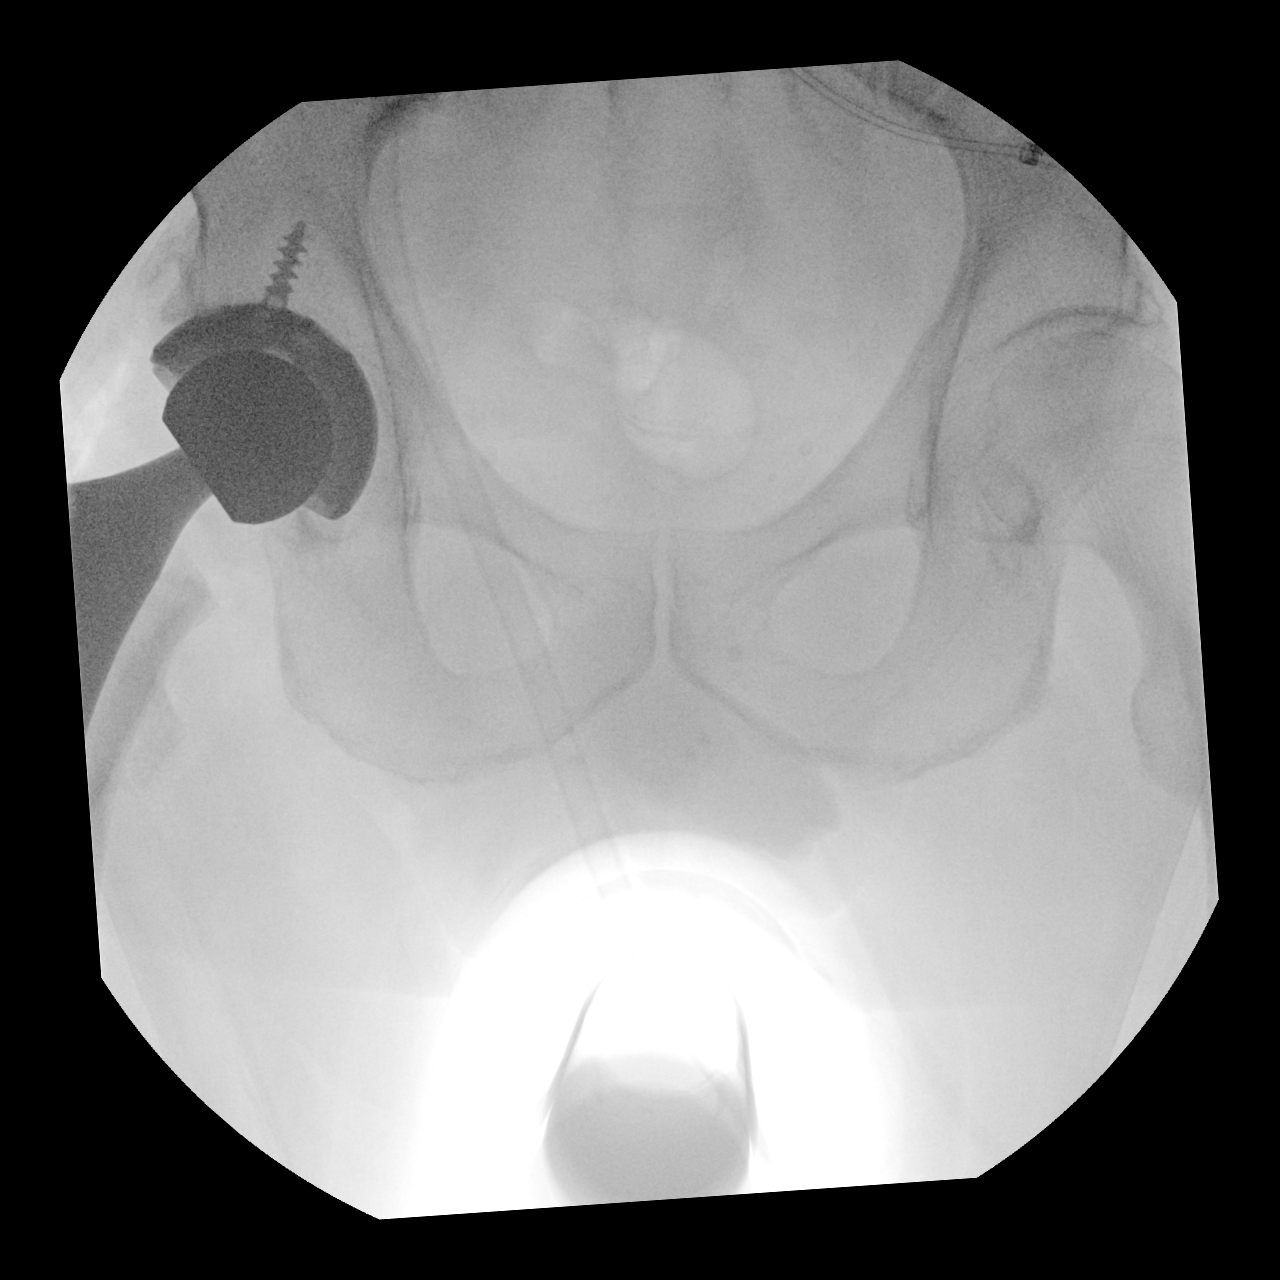

[2 of 2 positions shown; findings below may reference images not displayed]

FINDINGS: Two fluoroscopic spot views of the pelvis and right hip obtained in
the operating room in frontal projection. Right hip arthroplasty in
expected alignment. Total fluoroscopy time 7 seconds. Total dose
0.9111 mGy.
IMPRESSION: Procedural fluoroscopy for right hip arthroplasty.

## 2021-04-24 DIAGNOSIS — M1611 Unilateral primary osteoarthritis, right hip: Secondary | ICD-10-CM | POA: Diagnosis not present

## 2021-05-13 NOTE — Progress Notes (Signed)
[facility] Summary Report / Loop Recorder 

## 2021-05-25 ENCOUNTER — Ambulatory Visit (INDEPENDENT_AMBULATORY_CARE_PROVIDER_SITE_OTHER): Payer: Medicare Other

## 2021-05-25 DIAGNOSIS — I639 Cerebral infarction, unspecified: Secondary | ICD-10-CM | POA: Diagnosis not present

## 2021-05-25 LAB — CUP PACEART REMOTE DEVICE CHECK
Date Time Interrogation Session: 20220616232304
Implantable Pulse Generator Implant Date: 20181102

## 2021-06-15 NOTE — Progress Notes (Signed)
[facility] Summary Report / Loop Recorder 

## 2021-06-25 LAB — CUP PACEART REMOTE DEVICE CHECK
Date Time Interrogation Session: 20220721055715
Implantable Pulse Generator Implant Date: 20181102

## 2021-06-29 ENCOUNTER — Ambulatory Visit (INDEPENDENT_AMBULATORY_CARE_PROVIDER_SITE_OTHER): Payer: Medicare Other

## 2021-06-29 DIAGNOSIS — I639 Cerebral infarction, unspecified: Secondary | ICD-10-CM | POA: Diagnosis not present

## 2021-07-24 NOTE — Progress Notes (Signed)
[facility] Summary Report / Loop Recorder 

## 2021-07-27 ENCOUNTER — Ambulatory Visit (INDEPENDENT_AMBULATORY_CARE_PROVIDER_SITE_OTHER): Payer: Medicare Other

## 2021-07-27 DIAGNOSIS — I639 Cerebral infarction, unspecified: Secondary | ICD-10-CM

## 2021-07-28 LAB — CUP PACEART REMOTE DEVICE CHECK
Date Time Interrogation Session: 20220822000807
Implantable Pulse Generator Implant Date: 20181102

## 2021-08-03 DIAGNOSIS — M1611 Unilateral primary osteoarthritis, right hip: Secondary | ICD-10-CM | POA: Diagnosis not present

## 2021-08-03 DIAGNOSIS — I69398 Other sequelae of cerebral infarction: Secondary | ICD-10-CM | POA: Diagnosis not present

## 2021-08-03 DIAGNOSIS — E782 Mixed hyperlipidemia: Secondary | ICD-10-CM | POA: Diagnosis not present

## 2021-08-03 DIAGNOSIS — H547 Unspecified visual loss: Secondary | ICD-10-CM | POA: Diagnosis not present

## 2021-08-03 DIAGNOSIS — E1169 Type 2 diabetes mellitus with other specified complication: Secondary | ICD-10-CM | POA: Diagnosis not present

## 2021-08-03 DIAGNOSIS — E785 Hyperlipidemia, unspecified: Secondary | ICD-10-CM | POA: Diagnosis not present

## 2021-08-03 DIAGNOSIS — I672 Cerebral atherosclerosis: Secondary | ICD-10-CM | POA: Diagnosis not present

## 2021-08-03 DIAGNOSIS — I10 Essential (primary) hypertension: Secondary | ICD-10-CM | POA: Diagnosis not present

## 2021-08-12 NOTE — Progress Notes (Signed)
[facility] Summary Report / Loop Recorder 

## 2021-10-19 ENCOUNTER — Telehealth: Payer: Self-pay | Admitting: Cardiology

## 2021-10-19 NOTE — Telephone Encounter (Signed)
   1. Has your device fired?   2. Is you device beeping?   3. Are you experiencing draining or swelling at device site?   4. Are you calling to see if we received your device transmission?   5. Have you passed out?   Pt said his loop recorder battery went dead and he called [employer], [employer] instructed him to send the monitor back. He said he will not be sending anymore transmission    Please route to [place]

## 2021-10-19 NOTE — Telephone Encounter (Signed)
Spoke with patient and advised him that we need an appointment with Dr. Shonna Chock to discuss explant or leave the device in and that we would try to check the device as we do not have records on our end of device reaching RRT, patient agreeable to appointment with Dr. Curt Bears on 11/20/21 at 8:15

## 2021-10-19 NOTE — Telephone Encounter (Signed)
LVM for patient to call device clinic back regarding if he wanted loop recorder explanted or to leave loop recorder in

## 2021-11-20 ENCOUNTER — Ambulatory Visit: Payer: Medicare Other | Admitting: Cardiology

## 2021-11-20 ENCOUNTER — Other Ambulatory Visit: Payer: Self-pay

## 2021-11-20 ENCOUNTER — Encounter: Payer: Self-pay | Admitting: Cardiology

## 2021-11-20 VITALS — BP 136/88 | HR 59 | Ht 68.0 in | Wt 179.4 lb

## 2021-11-20 DIAGNOSIS — I350 Nonrheumatic aortic (valve) stenosis: Secondary | ICD-10-CM

## 2021-11-20 DIAGNOSIS — I639 Cerebral infarction, unspecified: Secondary | ICD-10-CM | POA: Diagnosis not present

## 2021-11-20 LAB — CUP PACEART INCLINIC DEVICE CHECK
Date Time Interrogation Session: 20221216082542
Implantable Pulse Generator Implant Date: 20181102

## 2021-11-20 NOTE — Progress Notes (Signed)
Electrophysiology Office Note   Date:  11/20/2021   ID:  Zachary Calhoun, DOB 09/18/1941, MRN [id number]  PCP:  [facility]  Cardiologist:   Primary Electrophysiologist:  Kennette Cuthrell Meredith Leeds, MD    No chief complaint on file.    History of Present Illness: Zachary Calhoun is a 80 y.o. male who is being seen today for the evaluation of CVA at the request of [facility] [place] Presenting today for electrophysiology evaluation.    He has a history significant for hypertension, hyperlipidemia, mild aortic stenosis.  He had a cryptogenic stroke November 2018.  He is status post Linq monitor.  Today, denies symptoms of palpitations, chest pain, shortness of breath, orthopnea, PND, lower extremity edema, claudication, dizziness, presyncope, syncope, bleeding, or neurologic sequela. The patient is tolerating medications without difficulties.  He is currently feeling well.  He has had no chest pain or shortness of breath.  Is able to do all of his daily activities.  He has had no further symptoms of stroke.  He has not noted any palpitations.   Past Medical History:  Diagnosis Date   AICD (automatic cardioverter/defibrillator) present    Pt states he only has loop recorder, 03/17/21   Arthritis    CVA (cerebral vascular accident) ([place])    DUE TO THROMBOSIS OF LEFT CEREBELLAR ARTERY   History of gout X 1   "left big toe"   History of kidney stones    "no OR" (10/05/2017)   HLD (hyperlipidemia)    Hypertension    Hypertriglyceridemia    Stroke [facility])    Past Surgical History:  Procedure Laterality Date   APPENDECTOMY     CARDIAC CATHETERIZATION     COLONOSCOPY     DR. Lyndel Safe   FLEXIBLE SIGMOIDOSCOPY  06/22/1993   WAS NORMAL TO 65 CMS   INGUINAL HERNIA REPAIR Bilateral    LOOP RECORDER INSERTION N/A 10/07/2017   Procedure: LOOP RECORDER INSERTION;  Surgeon: Constance Haw, MD;  Location: [place] CV LAB;  Service: Cardiovascular;  Laterality: N/A;    TEE WITHOUT CARDIOVERSION N/A 10/07/2017   Procedure: TRANSESOPHAGEAL ECHOCARDIOGRAM (TEE);  Surgeon: Dorothy Spark, MD;  Location: [facility] ENDOSCOPY;  Service: Cardiovascular;  Laterality: N/A;   TOTAL HIP ARTHROPLASTY Right 03/17/2021   Procedure: TOTAL HIP ARTHROPLASTY ANTERIOR APPROACH;  Surgeon: Renette Butters, MD;  [facility];  Service: Orthopedics;  Laterality: Right;     Current Outpatient Medications  Medication Sig Dispense Refill   atorvastatin (LIPITOR) 40 MG tablet Take 40 mg by mouth daily.     captopril (CAPOTEN) 50 MG tablet Take 50 mg by mouth 2 (two) times daily.     clopidogrel (PLAVIX) 75 MG tablet Take 1 tablet (75 mg total) by mouth daily. 30 tablet 0   methocarbamol (ROBAXIN) 500 MG tablet Take 1 tablet (500 mg total) by mouth every 8 (eight) hours as needed for muscle spasms. 20 tablet 0   Multiple Vitamin (MULTIVITAMIN) tablet Take 1 tablet daily by mouth.     niacin 500 MG tablet Take 500 mg daily by mouth.     Omega-3 Fatty Acids (FISH OIL) 1200 MG CAPS Take 1,200 mg by mouth daily.     acetaminophen (TYLENOL) 500 MG tablet Take 2 tablets (1,000 mg total) by mouth every 6 (six) hours as needed for mild pain or moderate pain. (Patient not taking: Reported on 11/20/2021) 60 tablet 0   aspirin EC 81 MG tablet Take 1 tablet (81  mg total) by mouth 2 (two) times daily. For DVT prophylaxis for 30 days after surgery. (Patient not taking: Reported on 11/20/2021) 60 tablet 0   Homeopathic Products (THERAWORX RELIEF EX) Apply 1 application topically daily as needed (cramps/pain). (Patient not taking: Reported on 11/20/2021)     meloxicam (MOBIC) 15 MG tablet Take 1 tablet (15 mg total) by mouth daily. (Patient not taking: Reported on 11/20/2021) 30 tablet 0   omeprazole (PRILOSEC OTC) 20 MG tablet Take 1 tablet (20 mg total) by mouth daily. For gastric protection 30 tablet 0   ondansetron (ZOFRAN) 4 MG tablet Take 1 tablet (4 mg total) by mouth daily as needed for nausea  or vomiting. (Patient not taking: Reported on 11/20/2021) 10 tablet 0   oxyCODONE (ROXICODONE) 5 MG immediate release tablet Take 1 tablet (5 mg total) by mouth every 8 (eight) hours as needed for severe pain. (Patient not taking: Reported on 11/20/2021) 20 tablet 0   No current facility-administered medications for this visit.    Allergies:   Hydrochlorothiazide   Social History:  The patient  reports that he has never smoked. He has never used smokeless tobacco. He reports that he does not drink alcohol and does not use drugs.   Family History:  The patient's family history includes Stroke in his mother.   ROS:  Please see the history of present illness.   Otherwise, review of systems is positive for none.   All other systems are reviewed and negative.   PHYSICAL EXAM: VS:  BP 136/88    Pulse (!) 59    Ht 5\' 8"  (1.727 m)    Wt 179 lb 6.4 oz (81.4 kg)    SpO2 96%    BMI 27.28 kg/m  , BMI Body mass index is 27.28 kg/m. GEN: Well nourished, well developed, in no acute distress  HEENT: normal  Neck: no JVD, carotid bruits, or masses Cardiac: RRR; no murmurs, rubs, or gallops,no edema  Respiratory:  clear to auscultation bilaterally, normal work of breathing GI: soft, nontender, nondistended, + BS MS: no deformity or atrophy  Skin: warm and dry, device site well healed Neuro:  Strength and sensation are intact Psych: euthymic mood, full affect  EKG:  EKG is not ordered today. Personal review of the ekg ordered 02/11/21 shows sinus rhythm, rate 54  Personal review of the device interrogation today. Results in [place]: 03/09/2021: BUN 12; Creatinine, Ser 1.20; Hemoglobin 14.2; Platelets 212; Potassium 4.9; Sodium 138    Lipid Panel     Component Value Date/Time   CHOL 87 10/06/2017 0342   TRIG 171 (H) 10/06/2017 0342   HDL 25 (L) 10/06/2017 0342   CHOLHDL 3.5 10/06/2017 0342   VLDL 34 10/06/2017 0342   LDLCALC 28 10/06/2017 0342     Wt Readings from Last 3  Encounters:  11/20/21 179 lb 6.4 oz (81.4 kg)  03/09/21 163 lb (73.9 kg)  02/11/21 185 lb 3.2 oz (84 kg)      Other studies Reviewed: Additional studies/ records that were reviewed today include: TTE 10/06/2017 Review of the above records today demonstrates:  - Left ventricle: The cavity size was normal. Systolic function was   normal. The estimated ejection fraction was in the range of 55%   to 60%. Wall motion was normal; there were no regional wall   motion abnormalities. - Aortic valve: Trileaflet; mildly thickened, mildly calcified   leaflets. Valve area (Vmax): 1.87 cm^2. - Mitral valve: There was  mild regurgitation. - Pulmonary arteries: Systolic pressure was mildly increased. PA   peak pressure: 31 mm Hg (S).   ASSESSMENT AND PLAN:  1.  Cryptogenic stroke: Status post Linq monitor implanted 10/07/2017.  No arrhythmias noted on device interrogation.  Device is at EOS.  At this point, he is happy to leave the device in.  He would like to avoid further procedures if possible.  2.  Hypertension: Currently well controlled.  Plan per primary physician.  3.  Mild aortic stenosis: Most recent echo in 2018.  We Albirta Rhinehart update his echo to ensure no progression of aortic stenosis.  Current medicines are reviewed at length with the patient today.   The patient does not have concerns regarding his medicines.  The following changes were made today: None  Labs/ tests ordered today include:  Orders Placed This Encounter  Procedures   ECHOCARDIOGRAM COMPLETE      Disposition:   FU with Ula Couvillon pending echo  Signed, Ricka Westra Meredith Leeds, MD  11/20/2021 8:26 AM     [facility] [address] [place] [address] [phone] (office) [phone] (fax)

## 2021-11-20 NOTE — Patient Instructions (Signed)
Medication Instructions:  Your physician recommends that you continue on your current medications as directed. Please refer to the Current Medication list given to you today.  *If you need a refill on your cardiac medications before your next appointment, please call your pharmacy*   Lab Work: None ordered   Testing/Procedures: Your physician has requested that you have an echocardiogram. Echocardiography is a painless test that uses sound waves to create images of your heart. It provides your doctor with information about the size and shape of your heart and how well your hearts chambers and valves are working. This procedure takes approximately one hour. There are no restrictions for this procedure.   Follow-Up: At [facility], you and your health needs are our priority.  As part of our continuing mission to provide you with exceptional heart care, we have created designated Provider Care Teams.  These Care Teams include your primary Cardiologist (physician) and Advanced Practice Providers (APPs -  Physician Assistants and Nurse Practitioners) who all work together to provide you with the care you need, when you need it.  We recommend signing up for the patient portal called "MyChart".  Sign up information is provided on this After Visit Summary.  MyChart is used to connect with patients for Virtual Visits (Telemedicine).  Patients are able to view lab/test results, encounter notes, upcoming appointments, etc.  Non-urgent messages can be sent to your provider as well.   To learn more about what you can do with MyChart, go to [email or web address].    Your next appointment:   As   needed  The format for your next appointment:   In Person  Provider:   Allegra Lai, MD    Thank you for choosing [facility]!!   Trinidad Curet, RN [phone]

## 2021-12-04 DIAGNOSIS — E782 Mixed hyperlipidemia: Secondary | ICD-10-CM | POA: Diagnosis not present

## 2021-12-04 DIAGNOSIS — I10 Essential (primary) hypertension: Secondary | ICD-10-CM | POA: Diagnosis not present

## 2021-12-04 DIAGNOSIS — E1169 Type 2 diabetes mellitus with other specified complication: Secondary | ICD-10-CM | POA: Diagnosis not present

## 2021-12-10 ENCOUNTER — Other Ambulatory Visit: Payer: Self-pay

## 2021-12-10 ENCOUNTER — Ambulatory Visit (HOSPITAL_COMMUNITY): Payer: Medicare Other | Attending: Internal Medicine

## 2021-12-10 DIAGNOSIS — I350 Nonrheumatic aortic (valve) stenosis: Secondary | ICD-10-CM | POA: Insufficient documentation

## 2021-12-10 LAB — ECHOCARDIOGRAM COMPLETE
AR max vel: 1.7 cm2
AV Area VTI: 1.57 cm2
AV Area mean vel: 1.5 cm2
AV Mean grad: 15 mmHg
AV Peak grad: 25.9 mmHg
Ao pk vel: 2.54 m/s
Area-P 1/2: 3.17 cm2
S' Lateral: 2.9 cm

## 2022-01-14 DIAGNOSIS — E782 Mixed hyperlipidemia: Secondary | ICD-10-CM | POA: Diagnosis not present

## 2022-01-14 DIAGNOSIS — I70211 Atherosclerosis of native arteries of extremities with intermittent claudication, right leg: Secondary | ICD-10-CM | POA: Diagnosis not present

## 2022-01-14 DIAGNOSIS — I69398 Other sequelae of cerebral infarction: Secondary | ICD-10-CM | POA: Diagnosis not present

## 2022-01-14 DIAGNOSIS — E1169 Type 2 diabetes mellitus with other specified complication: Secondary | ICD-10-CM | POA: Diagnosis not present

## 2022-01-14 DIAGNOSIS — Z0000 Encounter for general adult medical examination without abnormal findings: Secondary | ICD-10-CM | POA: Diagnosis not present

## 2022-01-14 DIAGNOSIS — H60393 Other infective otitis externa, bilateral: Secondary | ICD-10-CM | POA: Diagnosis not present

## 2022-01-14 DIAGNOSIS — I10 Essential (primary) hypertension: Secondary | ICD-10-CM | POA: Diagnosis not present

## 2022-01-14 DIAGNOSIS — H547 Unspecified visual loss: Secondary | ICD-10-CM | POA: Diagnosis not present

## 2022-01-14 DIAGNOSIS — N1831 Chronic kidney disease, stage 3a: Secondary | ICD-10-CM | POA: Diagnosis not present

## 2022-01-14 DIAGNOSIS — I672 Cerebral atherosclerosis: Secondary | ICD-10-CM | POA: Diagnosis not present

## 2022-01-14 DIAGNOSIS — I6930 Unspecified sequelae of cerebral infarction: Secondary | ICD-10-CM | POA: Diagnosis not present

## 2022-01-14 DIAGNOSIS — E785 Hyperlipidemia, unspecified: Secondary | ICD-10-CM | POA: Diagnosis not present

## 2022-02-14 NOTE — Progress Notes (Signed)
?Cardiology Office Note:   ? ?Date:  02/15/2022  ? ?ID:  Zachary Calhoun, DOB 11/25/1941, MRN [id number] ? ?PCP:  [facility] ?  ?[facility] Providers ?Cardiologist:  Werner Lean, MD    ? ?Referring MD: [facility] [place] ?CC: AS follow up ?Consulted for the evaluation of AS at the [employer] [employer] [facility] ? ?History of Present Illness:   ? ?Zachary Calhoun is a 81 y.o. male with a hx of prior embolic stroke, HTN and HLD, LINQ 2018 and mild AS 12/10/21 who presents to establish with gen cards. ? ?Patient notes that he is doing great.   ?He can still bale hay and can do about everything he wants to do.  Still mowes  ?There are no interval hospital/ED visit.   ? ?No chest pain or pressure .  No SOB/DOE and no PND/Orthopnea.  No weight gain or leg swelling.  No palpitations or syncope. ? ?Ambulatory blood pressure 130/60. ? ? ?Past Medical History:  ?Diagnosis Date  ? AICD (automatic cardioverter/defibrillator) present   ? Pt states he only has loop recorder, 03/17/21  ? Arthritis   ? CVA (cerebral vascular accident) [facility])   ? DUE TO THROMBOSIS OF LEFT CEREBELLAR ARTERY  ? History of gout X 1  ? "left big toe"  ? History of kidney stones   ? "no OR" (10/05/2017)  ? HLD (hyperlipidemia)   ? Hypertension   ? Hypertriglyceridemia   ? Stroke [facility])   ? ? ?Past Surgical History:  ?Procedure Laterality Date  ? APPENDECTOMY    ? CARDIAC CATHETERIZATION    ? COLONOSCOPY    ? DR. Lyndel Safe  ? FLEXIBLE SIGMOIDOSCOPY  06/22/1993  ? WAS NORMAL TO 65 CMS  ? INGUINAL HERNIA REPAIR Bilateral   ? LOOP RECORDER INSERTION N/A 10/07/2017  ? Procedure: LOOP RECORDER INSERTION;  Surgeon: Constance Haw, MD;  Location: [place] CV LAB;  Service: Cardiovascular;  Laterality: N/A;  ? TEE WITHOUT CARDIOVERSION N/A 10/07/2017  ? Procedure: TRANSESOPHAGEAL ECHOCARDIOGRAM (TEE);  Surgeon: Dorothy Spark, MD;  Location: [facility];  Service: Cardiovascular;  Laterality: N/A;  ? TOTAL HIP  ARTHROPLASTY Right 03/17/2021  ? Procedure: TOTAL HIP ARTHROPLASTY ANTERIOR APPROACH;  Surgeon: Renette Butters, MD;  [facility];  Service: Orthopedics;  Laterality: Right;  ? ? ?Current Medications: ?Current Meds  ?Medication Sig  ? atorvastatin (LIPITOR) 40 MG tablet Take 40 mg by mouth daily.  ? captopril (CAPOTEN) 50 MG tablet Take 50 mg by mouth 2 (two) times daily.  ? clopidogrel (PLAVIX) 75 MG tablet Take 1 tablet (75 mg total) by mouth daily.  ? methocarbamol (ROBAXIN) 500 MG tablet Take 1 tablet (500 mg total) by mouth every 8 (eight) hours as needed for muscle spasms.  ? Multiple Vitamin (MULTIVITAMIN) tablet Take 1 tablet daily by mouth.  ? niacin 500 MG tablet Take 500 mg daily by mouth.  ? Omega-3 Fatty Acids (FISH OIL) 1200 MG CAPS Take 1,200 mg by mouth daily.  ?  ? ?Allergies:   Hydrochlorothiazide  ? ?Social History  ? ?Socioeconomic History  ? Marital status: Married  ?  Spouse name: Not on file  ? Number of children: Not on file  ? Years of education: Not on file  ? Highest education level: Not on file  ?Occupational History  ? Not on file  ?Tobacco Use  ? Smoking status: Never  ? Smokeless tobacco: Never  ?Vaping Use  ?  Vaping Use: Never used  ?Substance and Sexual Activity  ? Alcohol use: No  ? Drug use: No  ? Sexual activity: Yes  ?Other Topics Concern  ? Not on file  ?Social History Narrative  ? Not on file  ? ?Social Determinants of Health  ? ?Financial Resource Strain: Not on file  ?Food Insecurity: Not on file  ?Transportation Needs: Not on file  ?Physical Activity: Not on file  ?Stress: Not on file  ?Social Connections: Not on file  ?  ?Family History: ?The patient's family history includes Stroke in his mother. ? ?ROS:   ?Please see the history of present illness.    ? All other systems reviewed and are negative. ? ?EKGs/Labs/Other Studies Reviewed:   ? ?The following studies were reviewed today: ? ?EKG:  EKG is  ordered today.  The ekg ordered today demonstrates  ?02/15/22: Sinus  bradycardia RBBB ? ?Recent Labs: ?03/09/2021: BUN 12; Creatinine, Ser 1.20; Hemoglobin 14.2; Platelets 212; Potassium 4.9; Sodium 138  ?Recent Lipid Panel ?   ?Component Value Date/Time  ? CHOL 87 10/06/2017 0342  ? TRIG 171 (H) 10/06/2017 0342  ? HDL 25 (L) 10/06/2017 0342  ? CHOLHDL 3.5 10/06/2017 0342  ? VLDL 34 10/06/2017 0342  ? [place] 28 10/06/2017 0342  ?    ? ?Physical Exam:   ? ?VS:  BP (!) 154/70   Pulse (!) 47   Ht '5\' 8"'$  (1.727 m)   Wt 183 lb 9.6 oz (83.3 kg)   SpO2 100%   BMI 27.92 kg/m?    ? ?Wt Readings from Last 3 Encounters:  ?02/15/22 183 lb 9.6 oz (83.3 kg)  ?11/20/21 179 lb 6.4 oz (81.4 kg)  ?03/09/21 163 lb (73.9 kg)  ?  ?Repeat BP L arm 150/70 ? ?Gen: no distress,    ?Neck: No JVD,  ?Cardiac: No Rubs or Gallops, systolic ejection murmur, regular bradycardia, +2 radial pulses ?Respiratory: Clear to auscultation bilaterally, normal effort, normal  respiratory rate ?GI: Soft, nontender, non-distended  ?MS: No  edema;  moves all extremities ?Integument: Skin feels warm ?Neuro:  At time of evaluation, alert and oriented to person/place/time/situation  ?Psych: Normal affect, patient feels great ? ? ?ASSESSMENT:   ? ?1. Nonrheumatic aortic valve stenosis   ? ?PLAN:   ? ?Cryptogenic stroke ?HTN ?HLD ?- BP WNL at [place] and on ambulatory monitor ?- continue ambulatory BP ?- plavix is reasonable given prior stroke ? - continue atorvastatin 40 mg, LDL goal < 100 ?- I do not thing his niacin and fish oil make significant improvements in his care, these could be stopped to decrease pill burden ?- he would like his ILR removed, will send a message to Dr. Curt Bears ? ?Mild AS ?- mean gradient 15 mm Hg. ?- we have reviewed red flag symptoms for this disease ?- will need one year f/u with me and echo in two years unless new sx ? ?   ? ? ?Medication Adjustments/Labs and Tests Ordered: ?Current medicines are reviewed at length with the patient today.  Concerns regarding medicines are  outlined above.  ?Orders Placed This Encounter  ?Procedures  ? EKG 12-Lead  ? ?No orders of the defined types were placed in this encounter. ? ? ?Patient Instructions  ?Medication Instructions:  ?Your physician recommends that you continue on your current medications as directed. Please refer to the Current Medication list given to you today. ? ?*If you need a refill on your cardiac medications before your next  appointment, please call your pharmacy* ? ? ?Lab Work: ?NONE ?If you have labs (blood work) drawn today and your tests are completely normal, you will receive your results only by: ?MyChart Message (if you have MyChart) OR ?A paper copy in the mail ?If you have any lab test that is abnormal or we need to change your treatment, we will call you to review the results. ? ? ?Testing/Procedures: ?NONE ? ? ?Follow-Up: ?At [facility], you and your health needs are our priority.  As part of our continuing mission to provide you with exceptional heart care, we have created designated Provider Care Teams.  These Care Teams include your primary Cardiologist (physician) and Advanced Practice Providers (APPs -  Physician Assistants and Nurse Practitioners) who all work together to provide you with the care you need, when you need it. ? ?We recommend signing up for the patient portal called "MyChart".  Sign up information is provided on this After Visit Summary.  MyChart is used to connect with patients for Virtual Visits (Telemedicine).  Patients are able to view lab/test results, encounter notes, upcoming appointments, etc.  Non-urgent messages can be sent to your provider as well.   ?To learn more about what you can do with MyChart, go to [email or web address].   ? ?Your next appointment:   ?1 year(s) ? ?The format for your next appointment:   ?In Person ? ?Provider:   ?Werner Lean, MD   ? ?  ? ?Signed, ?Werner Lean, MD  ?02/15/2022 8:46 AM    ?[place] ? ?

## 2022-02-15 ENCOUNTER — Encounter: Payer: Self-pay | Admitting: Internal Medicine

## 2022-02-15 ENCOUNTER — Ambulatory Visit: Payer: Medicare Other | Admitting: Internal Medicine

## 2022-02-15 ENCOUNTER — Other Ambulatory Visit: Payer: Self-pay

## 2022-02-15 VITALS — BP 154/70 | HR 47 | Ht 68.0 in | Wt 183.6 lb

## 2022-02-15 DIAGNOSIS — I350 Nonrheumatic aortic (valve) stenosis: Secondary | ICD-10-CM | POA: Diagnosis not present

## 2022-02-15 DIAGNOSIS — I10 Essential (primary) hypertension: Secondary | ICD-10-CM

## 2022-02-15 DIAGNOSIS — E785 Hyperlipidemia, unspecified: Secondary | ICD-10-CM

## 2022-02-15 NOTE — Patient Instructions (Signed)
Medication Instructions:  ?Your physician recommends that you continue on your current medications as directed. Please refer to the Current Medication list given to you today. ? ?*If you need a refill on your cardiac medications before your next appointment, please call your pharmacy* ? ? ?Lab Work: ?NONE ?If you have labs (blood work) drawn today and your tests are completely normal, you will receive your results only by: ?MyChart Message (if you have MyChart) OR ?A paper copy in the mail ?If you have any lab test that is abnormal or we need to change your treatment, we will call you to review the results. ? ? ?Testing/Procedures: ?NONE ? ? ?Follow-Up: ?At [facility], you and your health needs are our priority.  As part of our continuing mission to provide you with exceptional heart care, we have created designated Provider Care Teams.  These Care Teams include your primary Cardiologist (physician) and Advanced Practice Providers (APPs -  Physician Assistants and Nurse Practitioners) who all work together to provide you with the care you need, when you need it. ? ?We recommend signing up for the patient portal called "MyChart".  Sign up information is provided on this After Visit Summary.  MyChart is used to connect with patients for Virtual Visits (Telemedicine).  Patients are able to view lab/test results, encounter notes, upcoming appointments, etc.  Non-urgent messages can be sent to your provider as well.   ?To learn more about what you can do with MyChart, go to [email or web address].   ? ?Your next appointment:   ?1 year(s) ? ?The format for your next appointment:   ?In Person ? ?Provider:   ?Werner Lean, MD   ? ? ?

## 2022-03-10 DIAGNOSIS — M1611 Unilateral primary osteoarthritis, right hip: Secondary | ICD-10-CM | POA: Diagnosis not present

## 2022-04-06 DIAGNOSIS — J302 Other seasonal allergic rhinitis: Secondary | ICD-10-CM | POA: Diagnosis not present

## 2022-04-06 DIAGNOSIS — H9201 Otalgia, right ear: Secondary | ICD-10-CM | POA: Diagnosis not present

## 2022-04-27 ENCOUNTER — Telehealth: Payer: Self-pay

## 2022-04-27 ENCOUNTER — Encounter: Payer: Self-pay | Admitting: Gastroenterology

## 2022-04-27 ENCOUNTER — Ambulatory Visit (INDEPENDENT_AMBULATORY_CARE_PROVIDER_SITE_OTHER): Payer: Medicare Other | Admitting: Gastroenterology

## 2022-04-27 VITALS — BP 138/72 | HR 56 | Ht 67.0 in | Wt 180.0 lb

## 2022-04-27 DIAGNOSIS — Z7902 Long term (current) use of antithrombotics/antiplatelets: Secondary | ICD-10-CM

## 2022-04-27 DIAGNOSIS — Z86010 Personal history of colonic polyps: Secondary | ICD-10-CM | POA: Diagnosis not present

## 2022-04-27 MED ORDER — NA SULFATE-K SULFATE-MG SULF 17.5-3.13-1.6 GM/177ML PO SOLN: 1.0000 | Freq: Once | ORAL | 0 refills | Status: AC

## 2022-04-27 NOTE — Telephone Encounter (Signed)
Dr. Gasper Sells, you saw patient recently on 02/15/22. Would you be willing to comment on request for patient to hold Plavix x 5 days for upcoming colonoscopy? History of prior embolic stroke with LINQ placed 2018, no evidence of atrial fibrillation.  Please route your response to p cv div preop.  Thank you,  Emmaline Life, NP-C    04/27/2022, 12:38 PM [place] [address] Office [phone] Fax [phone]

## 2022-04-27 NOTE — Telephone Encounter (Signed)
Primary Cardiologist:Mahesh A Chandrasekhar, MD  Chart reviewed as part of pre-operative protocol coverage. Because of Zachary Calhoun's past medical history and time since last visit, he/she will require a virtual visit/telephone call in order to better assess preoperative cardiovascular risk.  Pre-op covering staff: - Please contact patient, obtain consent, and schedule appointment   Per Dr. Gasper Sells, patient may hold Plavix x 5 days prior to procedure.   Emmaline Life, NP-C    04/27/2022, 1:42 PM [place] [facility] [address], [address] Office [phone] Fax [phone]

## 2022-04-27 NOTE — Telephone Encounter (Signed)
[facility] Pre-operative Risk Assessment     Request for surgical clearance:     Endoscopy Procedure  What type of surgery is being performed?     COLONOSCOPY  When is this surgery scheduled?     06-03-22  What type of clearance is required ?   Pharmacy  Are there any medications that need to be held prior to surgery and how long? PLAVIX 5 DAYS  Practice name and name of physician performing surgery?      [facility] DR Wishram Cellar  What is your office phone and fax number?      Phone- [phone]  Fax- [phone] [place]  Anesthesia type (None, local, MAC, general) ?       MAC   THANK YOU

## 2022-04-27 NOTE — Patient Instructions (Addendum)
If you are age 81 or older, your body mass index should be between 23-30. Your Body mass index is 28.19 kg/m. If this is out of the aforementioned range listed, please consider follow up with your Primary Care Provider.  If you are age 57 or younger, your body mass index should be between 19-25. Your Body mass index is 28.19 kg/m. If this is out of the aformentioned range listed, please consider follow up with your Primary Care Provider.   ________________________________________________________  The [facility] providers would like to encourage you to use [facility] to communicate with providers for non-urgent requests or questions.  Due to long hold times on the telephone, sending your provider a message by [facility] may be a faster and more efficient way to get a response.  Please allow 48 business hours for a response.  Please remember that this is for non-urgent requests.  _______________________________________________________  [name] have been scheduled for a colonoscopy. Please follow written instructions given to you at your visit today.  Please pick up your prep supplies at the pharmacy within the next 1-3 days. If you use inhalers (even only as needed), please bring them with you on the day of your procedure.   You will be contacted by our office prior to your procedure for directions on holding your Plavix.  If you do not hear from our office 1 week prior to your scheduled procedure, please call [phone] to discuss.    PLEASE TAKE AN 81 MG ASPIRIN WHILE YOU ARE HOLDING PLAVIX.  Thank you for entrusting me with your care and for choosing [facility], Dr. [place] Cellar

## 2022-04-27 NOTE — Progress Notes (Signed)
HPI :  81 year old male with a history of CVA on Plavix, colon polyps, HTN, here to reestablish care and discuss need for surveillance colonoscopy.  I last saw him in July 2019 for colonoscopy.  He had 12 small adenomas removed, recommended a repeat colonoscopy in 1 year.  He did not follow-up for that until now.  He endorses some slight constipation at times, but does have a bowel movement usually once daily.  No blood in his stools.  He uses a stool softener which can help him.  He denies any abdominal pains.  He is on Plavix for history of CVA that he had in 2018.  He has been doing really well in this regard.  He actually had a hip replacement last year with anesthesia and tolerated it well.  He denies any cardiopulmonary symptoms.  No recent has symptoms of stroke or TIA.  He denies any history of anemia. He had a recent echocardiogram below, has mild aortic stenosis with a good EF.  We had a discussion about timing of stopping further surveillance.  He is not comfortable stopping colonoscopy surveillance right now.    Echo 12/10/2021 - EF 65-70%. Grade I DD, mild AS   Colonoscopy 01/14/2014 - fair prep in right colon, could not visualize completely, 50m transverse adenoma, diverticulosis,     Colonoscopy 06/07/2018 - - The examined portion of the ileum was normal. - Two 3 to 4 mm polyps in the cecum, removed with a cold snare. Resected and retrieved. - One 3 mm polyp at the ileocecal valve, removed with a cold snare. Resected and retrieved. - One 3 mm polyp in the ascending colon, removed with a cold snare. Resected and retrieved. - Three 3 to 4 mm polyps at the hepatic flexure, removed with a cold snare. Resected and retrieved. - Three 3 to 5 mm polyps in the transverse colon, removed with a cold snare. Resected and retrieved. - One 4 mm polyp in the descending colon, removed with a cold snare. Resected and retrieved. - One 6 mm polyp in the sigmoid colon, removed with a cold snare.  Resected and retrieved. - Diverticulosis in the transverse colon and in the ascending colon. - Internal hemorrhoids. - The examination was otherwise normal.  Surgical [P], cecum, ileocecal valve, ascending, hepatic flexure, transverse, descending, rectosigmoid, polyp (12) - TUBULAR ADENOMA(S). - HIGH GRADE DYSPLASIA IS NOT IDENTIFIED.  Recommended a repeat in one year   Past Medical History:  Diagnosis Date   AICD (automatic cardioverter/defibrillator) present    Pt states he only has loop recorder, 03/17/21   Arthritis    [place]    DUE TO THROMBOSIS OF LEFT CEREBELLAR ARTERY   History of gout X 1   "left big toe"   History of kidney stones    "no OR" (10/05/2017)   HLD (hyperlipidemia)    Hypertension    Hypertriglyceridemia    Stroke [facility]      Past Surgical History:  Procedure Laterality Date   APPENDECTOMY     CARDIAC CATHETERIZATION     COLONOSCOPY     DR. GLyndel Safe  FLEXIBLE SIGMOIDOSCOPY  06/22/1993   WAS NORMAL TO 65 CMS   INGUINAL HERNIA REPAIR Bilateral    LOOP RECORDER INSERTION N/A 10/07/2017   Procedure: LOOP RECORDER INSERTION;  Surgeon: CConstance Haw MD;  [facility];  Service: Cardiovascular;  Laterality: N/A;   TEE WITHOUT CARDIOVERSION N/A 10/07/2017   Procedure: TRANSESOPHAGEAL ECHOCARDIOGRAM (TEE);  Surgeon: Dorothy Spark, MD;  Location: [facility] ENDOSCOPY;  Service: Cardiovascular;  Laterality: N/A;   TOTAL HIP ARTHROPLASTY Right 03/17/2021   Procedure: TOTAL HIP ARTHROPLASTY ANTERIOR APPROACH;  Surgeon: Renette Butters, MD;  Location: [facility] ORS;  Service: Orthopedics;  Laterality: Right;   Family History  Problem Relation Age of Onset   Stroke Mother    Colon cancer Neg Hx    Stomach cancer Neg Hx    Social History   Tobacco Use   Smoking status: Never   Smokeless tobacco: Never  Vaping Use   Vaping Use: Never used  Substance Use Topics   Alcohol use: No   Drug use: No   Current  Outpatient Medications  Medication Sig Dispense Refill   atorvastatin (LIPITOR) 40 MG tablet Take 40 mg by mouth daily.     captopril (CAPOTEN) 50 MG tablet Take 50 mg by mouth 2 (two) times daily.     clopidogrel (PLAVIX) 75 MG tablet Take 1 tablet (75 mg total) by mouth daily. 30 tablet 0   methocarbamol (ROBAXIN) 500 MG tablet Take 1 tablet (500 mg total) by mouth every 8 (eight) hours as needed for muscle spasms. 20 tablet 0   Multiple Vitamin (MULTIVITAMIN) tablet Take 1 tablet daily by mouth.     niacin 500 MG tablet Take 500 mg daily by mouth.     Omega-3 Fatty Acids (FISH OIL) 1200 MG CAPS Take 1,200 mg by mouth daily.     omeprazole (PRILOSEC OTC) 20 MG tablet Take 1 tablet (20 mg total) by mouth daily. For gastric protection 30 tablet 0   No current facility-administered medications for this visit.   Allergies  Allergen Reactions   Hydrochlorothiazide     MALAISE AND WEAKNESS     Review of Systems: All systems reviewed and negative except where noted in HPI.   Lab Results  Component Value Date   WBC 5.4 03/09/2021   HGB 14.2 03/09/2021   HCT 42.1 03/09/2021   MCV 98.6 03/09/2021   PLT 212 03/09/2021    Lab Results  Component Value Date   CREATININE 1.20 03/09/2021   BUN 12 03/09/2021   NA 138 03/09/2021   K 4.9 03/09/2021   CL 105 03/09/2021   CO2 26 03/09/2021    Lab Results  Component Value Date   ALT 32 10/05/2017   AST 36 10/05/2017   ALKPHOS 77 10/05/2017   BILITOT 0.7 10/05/2017     Physical Exam: BP 138/72   Pulse (!) 56   Ht '5\' 7"'$  (1.702 m)   Wt 180 lb (81.6 kg)   SpO2 99%   BMI 28.19 kg/m  Constitutional: Pleasant,well-developed, male in no acute distress. HEENT: Normocephalic and atraumatic. Conjunctivae are normal. No scleral icterus. Neck supple.  Cardiovascular: Normal rate, regular rhythm. 2/6 SEM Pulmonary/chest: Effort normal and breath sounds normal. No wheezing Abdominal: Soft, nondistended, nontender.  There are no masses  palpable.  Extremities: no edema Neurological: Alert and oriented to person place and time. Skin: Skin is warm and dry. No rashes noted. Psychiatric: Normal mood and affect. Behavior is normal.   ASSESSMENT AND PLAN: 81 year old male here to reestablish care for the following:  History of colon polyps Antiplatelet use for history of CVA  Patient is overdue for surveillance colonoscopy.  He had 12 adenomas on his last colonoscopy.  We discussed if he wanted to have any further surveillance colonoscopies at this point in his life, he is currently 81 years old.  I discussed  colonoscopy with him, risks of that and anesthesia.  In order to do colonoscopy we will need to hold his Plavix for 5 days, at which time he could be at risk for recurrent CVA although risk for that would be quite low.  We discussed that many patients opted to stop having surveillance colonoscopies at this age, however he is not comfortable stopping right now when he wants to have 1 more exam before he stops surveillance.  He understands risks of the exam and wants to proceed.  We will schedule him for colonoscopy at the [facility], further recommendations pending results.  He can take baby aspirin during the time that he is holding his Plavix.  He agreed.  Plan: - schedule colonoscopy at [facility] - will ask for approval to hold Plavix for 5 days prior to the exam - he can take aspirin '81mg'$  / day while holding Plavix  Jolly Mango, MD [facility]astro[facility]

## 2022-04-27 NOTE — Telephone Encounter (Signed)
I have spoken to patient to advise that per cardiology, he may hold plavix 5 days prior to his upcoming procedure. Patient verbalizes understanding of this information. 

## 2022-04-28 NOTE — Telephone Encounter (Signed)
Okay; thanks.

## 2022-04-28 NOTE — Telephone Encounter (Signed)
The pt has not been fully cleared for his procedure. Per notes the pt needs a telephone appt with the pre op provider, which at that time we will go over the medication recommendations.

## 2022-04-28 NOTE — Telephone Encounter (Signed)
Left message for the pt to call and schedule a telephone pre op appt.

## 2022-04-29 ENCOUNTER — Telehealth: Payer: Self-pay | Admitting: Internal Medicine

## 2022-04-29 NOTE — Telephone Encounter (Signed)
Pt returning nurses call regarding Pre Op appt. Please advise 

## 2022-04-29 NOTE — Telephone Encounter (Signed)
Pt agreeable to plan of care for tele pre op appt 05/10/22 @ 9 am. Med rec and consent have been done. Pt thanked me for the call and the help.

## 2022-04-29 NOTE — Telephone Encounter (Signed)
Pt agreeable to plan of care for tele pre op appt 05/10/22 @ 9 am. Med rec and consent have been done. Pt thanked me for the call and the help.     Patient Consent for Virtual Visit        Zachary Calhoun has provided verbal consent on 04/29/2022 for a virtual visit (video or telephone).   CONSENT FOR VIRTUAL VISIT FOR:  Zachary Calhoun  By participating in this virtual visit I agree to the following:  I hereby voluntarily request, consent and authorize [facility] and its employed or contracted physicians, physician assistants, nurse practitioners or other licensed health care professionals (the Practitioner), to provide me with telemedicine health care services (the "Services") as deemed necessary by the treating Practitioner. I acknowledge and consent to receive the Services by the Practitioner via telemedicine. I understand that the telemedicine visit will involve communicating with the Practitioner through live audiovisual communication technology and the disclosure of certain medical information by electronic transmission. I acknowledge that I have been given the opportunity to request an in-person assessment or other available alternative prior to the telemedicine visit and am voluntarily participating in the telemedicine visit.  I understand that I have the right to withhold or withdraw my consent to the use of telemedicine in the course of my care at any time, without affecting my right to future care or treatment, and that the Practitioner or I may terminate the telemedicine visit at any time. I understand that I have the right to inspect all information obtained and/or recorded in the course of the telemedicine visit and may receive copies of available information for a reasonable fee.  I understand that some of the potential risks of receiving the Services via telemedicine include:  Delay or interruption in medical evaluation due to technological equipment failure or  disruption; Information transmitted may not be sufficient (e.g. poor resolution of images) to allow for appropriate medical decision making by the Practitioner; and/or  In rare instances, security protocols could fail, causing a breach of personal health information.  Furthermore, I acknowledge that it is my responsibility to provide information about my medical history, conditions and care that is complete and accurate to the best of my ability. I acknowledge that Practitioner's advice, recommendations, and/or decision may be based on factors not within their control, such as incomplete or inaccurate data provided by me or distortions of diagnostic images or specimens that may result from electronic transmissions. I understand that the practice of medicine is not an exact science and that Practitioner makes no warranties or guarantees regarding treatment outcomes. I acknowledge that a copy of this consent can be made available to me via my patient portal ([place]), or I can request a printed copy by calling the office of [facility].    I understand that my insurance will be billed for this visit.   I have read or had this consent read to me. I understand the contents of this consent, which adequately explains the benefits and risks of the Services being provided via telemedicine.  I have been provided ample opportunity to ask questions regarding this consent and the Services and have had my questions answered to my satisfaction. I give my informed consent for the services to be provided through the use of telemedicine in my medical care

## 2022-05-10 ENCOUNTER — Ambulatory Visit (INDEPENDENT_AMBULATORY_CARE_PROVIDER_SITE_OTHER): Payer: Medicare Other | Admitting: Physician Assistant

## 2022-05-10 DIAGNOSIS — Z01810 Encounter for preprocedural cardiovascular examination: Secondary | ICD-10-CM

## 2022-05-10 NOTE — Progress Notes (Signed)
Virtual Visit via Telephone Note   Because of Zachary Calhoun's co-morbid illnesses, he is at least at moderate risk for complications without adequate follow up.  This format is felt to be most appropriate for this patient at this time.  The patient did not have access to video technology/had technical difficulties with video requiring transitioning to audio format only (telephone).  All issues noted in this document were discussed and addressed.  No physical exam could be performed with this format.  Please refer to the patient's chart for his consent to telehealth for [facility].  Evaluation Performed:  Preoperative cardiovascular risk assessment _____________   Date:  05/10/2022   Patient ID:  Zachary Calhoun, DOB 05/24/1941, MRN [id number] Patient Location:  Home Provider location:   Office  Primary Care Provider:  Physicians, [facility] Primary Cardiologist:  Werner Lean, MD  Chief Complaint / Patient Profile   81 y.o. y/o male with a h/o prior embolic stroke s/p loop recorder, HTN, HLD and aortic stenosis who is pending colonoscopy and presents today for telephonic preoperative cardiovascular risk assessment.  Past Medical History    Past Medical History:  Diagnosis Date   AICD (automatic cardioverter/defibrillator) present    Pt states he only has loop recorder, 03/17/21   Arthritis    CVA (cerebral vascular accident) ([place])    DUE TO THROMBOSIS OF LEFT CEREBELLAR ARTERY   History of gout X 1   "left big toe"   History of kidney stones    "no OR" (10/05/2017)   HLD (hyperlipidemia)    Hypertension    Hypertriglyceridemia    Stroke [facility])    Past Surgical History:  Procedure Laterality Date   APPENDECTOMY     CARDIAC CATHETERIZATION     COLONOSCOPY     DR. Lyndel Safe   FLEXIBLE SIGMOIDOSCOPY  06/22/1993   WAS NORMAL TO 65 CMS   INGUINAL HERNIA REPAIR Bilateral    LOOP RECORDER INSERTION N/A 10/07/2017   Procedure: LOOP RECORDER INSERTION;  Surgeon:  Constance Haw, MD;  Location: [facility];  Service: Cardiovascular;  Laterality: N/A;   TEE WITHOUT CARDIOVERSION N/A 10/07/2017   Procedure: TRANSESOPHAGEAL ECHOCARDIOGRAM (TEE);  Surgeon: Dorothy Spark, MD;  Location: [facility] ENDOSCOPY;  Service: Cardiovascular;  Laterality: N/A;   TOTAL HIP ARTHROPLASTY Right 03/17/2021   Procedure: TOTAL HIP ARTHROPLASTY ANTERIOR APPROACH;  Surgeon: Renette Butters, MD;  Location: [facility] ORS;  Service: Orthopedics;  Laterality: Right;    Allergies  Allergies  Allergen Reactions   Hydrochlorothiazide     MALAISE AND WEAKNESS    History of Present Illness    Zachary Calhoun is a 81 y.o. male who presents via audio/video conferencing for a telehealth visit today.  Pt was last seen in cardiology clinic on 02/15/2022 by Dr. Gasper Sells.  At that time CESAR ROGERSON was doing well.  The patient is now pending procedure as outlined above. Since his last visit, he has been doing well without exertional chest pain or worsening dyspnea.  He is clearly able to accomplish more than 4 METS of activity.   Home Medications    Prior to Admission medications   Medication Sig Start Date End Date Taking? Authorizing Provider  atorvastatin (LIPITOR) 40 MG tablet Take 40 mg by mouth daily. 09/09/17   [provider]  captopril (CAPOTEN) 50 MG tablet Take 50 mg by mouth 2 (two) times daily. 11/07/17   [provider]  clopidogrel (PLAVIX) 75 MG tablet  Take 1 tablet (75 mg total) by mouth daily. 10/08/17   Doreatha Lew, MD  methocarbamol (ROBAXIN) 500 MG tablet Take 1 tablet (500 mg total) by mouth every 8 (eight) hours as needed for muscle spasms. 03/17/21   Britt Bottom, PA-C  Multiple Vitamin (MULTIVITAMIN) tablet Take 1 tablet daily by mouth.    [provider]  niacin 500 MG tablet Take 500 mg daily by mouth.    [provider]  Omega-3 Fatty Acids (FISH OIL) 1200 MG CAPS Take 1,200 mg by mouth daily.    [provider]  omeprazole (PRILOSEC OTC) 20 MG tablet Take 1 tablet (20 mg total) by mouth daily. For gastric protection 03/17/21 04/29/22  Britt Bottom, PA-C    Physical Exam    Vital Signs:  [name] does not have vital signs available for review today.  Given telephonic nature of communication, physical exam is limited. AAOx3. NAD. Normal affect.  Speech and respirations are unlabored.  Accessory Clinical Findings    None  Assessment & Plan    1.  Preoperative Cardiovascular Risk Assessment:  -Patient has upcoming colonoscopy.  He is able to accomplish more than 4 METS of activity.  He may proceed with upcoming colonoscopy without further work-up.  He has been instructed to hold the Plavix for 5 days prior to the procedure and restart as soon as possible afterward at the surgeon's discretion.  A copy of this note will be routed to requesting surgeon.  Time:   Today, I have spent 4 minutes with the patient with telehealth technology discussing medical history, symptoms, and management plan.     Almyra Deforest, [place]  05/10/2022, 8:56 AM

## 2022-05-10 NOTE — Telephone Encounter (Signed)
Thank you very much for the update and seeing him

## 2022-05-10 NOTE — Telephone Encounter (Signed)
Please see today's virtual telephone visit, patient is cleared to proceed with upcoming GI procedure.

## 2022-06-03 ENCOUNTER — Encounter: Payer: Self-pay | Admitting: Gastroenterology

## 2022-06-03 ENCOUNTER — Ambulatory Visit (AMBULATORY_SURGERY_CENTER): Payer: Medicare Other | Admitting: Gastroenterology

## 2022-06-03 VITALS — BP 126/55 | HR 80 | Temp 97.5°F | Resp 22 | Ht 67.0 in | Wt 180.0 lb

## 2022-06-03 DIAGNOSIS — Z09 Encounter for follow-up examination after completed treatment for conditions other than malignant neoplasm: Secondary | ICD-10-CM

## 2022-06-03 DIAGNOSIS — D125 Benign neoplasm of sigmoid colon: Secondary | ICD-10-CM

## 2022-06-03 DIAGNOSIS — Z86010 Personal history of colonic polyps: Secondary | ICD-10-CM

## 2022-06-03 DIAGNOSIS — D123 Benign neoplasm of transverse colon: Secondary | ICD-10-CM | POA: Diagnosis not present

## 2022-06-03 DIAGNOSIS — D124 Benign neoplasm of descending colon: Secondary | ICD-10-CM

## 2022-06-03 DIAGNOSIS — D122 Benign neoplasm of ascending colon: Secondary | ICD-10-CM

## 2022-06-03 DIAGNOSIS — I10 Essential (primary) hypertension: Secondary | ICD-10-CM | POA: Diagnosis not present

## 2022-06-03 MED ORDER — SODIUM CHLORIDE 0.9 % IV SOLN: 500.0000 mL | INTRAVENOUS | Status: DC

## 2022-06-03 NOTE — Op Note (Signed)
[facility] Patient Name: Zachary Calhoun Procedure Date: 06/03/2022 11:38 AM MRN: [id number] Endoscopist: Remo Lipps P. Havery Moros , MD Age: 81 Referring MD:  Date of Birth: 06/02/1941 Gender: Male Account #: [id number] Procedure:                Colonoscopy Indications:              High risk colon cancer surveillance: Personal                            history of colonic polyps- 12 adenomas removed                            06/2018 Medicines:                Monitored Anesthesia Care Procedure:                Pre-Anesthesia Assessment:                           - Prior to the procedure, a History and Physical                            was performed, and patient medications and                            allergies were reviewed. The patient's tolerance of                            previous anesthesia was also reviewed. The risks                            and benefits of the procedure and the sedation                            options and risks were discussed with the patient.                            All questions were answered, and informed consent                            was obtained. Prior Anticoagulants: The patient has                            taken Plavix (clopidogrel), last dose was 5 days                            prior to procedure. ASA Grade Assessment: III - A                            patient with severe systemic disease. After                            reviewing the risks and benefits, the patient was  deemed in satisfactory condition to undergo the                            procedure.                           After obtaining informed consent, the colonoscope                            was passed under direct vision. Throughout the                            procedure, the patient's blood pressure, pulse, and                            oxygen saturations were monitored continuously. The                            Olympus  [id number]-[id number]) Colonoscope was                            introduced through the anus and advanced to the the                            cecum, identified by appendiceal orifice and                            ileocecal valve. The colonoscopy was performed                            without difficulty. The patient tolerated the                            procedure well. The quality of the bowel                            preparation was adequate. The ileocecal valve,                            appendiceal orifice, and rectum were photographed. Scope In: 11:45:35 AM Scope Out: 12:12:16 PM Scope Withdrawal Time: 0 hours 21 minutes 27 seconds  Total Procedure Duration: 0 hours 26 minutes 41 seconds  Findings:                 The perianal and digital rectal examinations were                            normal.                           Scattered small-mouthed diverticula were found in                            the transverse colon and right colon.  A 5 mm polyp was found in the ascending colon. The                            polyp was sessile. The polyp was removed with a                            cold snare. Resection and retrieval were complete.                           Two sessile polyps were found in the transverse                            colon. The polyps were 4 to 8 mm in size. These                            polyps were removed with a cold snare. Resection                            and retrieval were complete.                           A 3 mm polyp was found in the descending colon. The                            polyp was sessile. The polyp was removed with a                            cold snare. Resection and retrieval were complete.                           Two sessile polyps were found in the sigmoid colon.                            The polyps were 4 to 6 mm in size. These polyps                            were removed with a cold snare.  Resection and                            retrieval were complete.                           Internal hemorrhoids were found during retroflexion.                           There was some residual stool noted, mostly right                            colon, which was lavaged to achieve adequate views.                            The exam was otherwise without abnormality. Complications:  No immediate complications. Estimated blood loss:                            Minimal. Estimated Blood Loss:     Estimated blood loss was minimal. Impression:               - Diverticulosis in the transverse colon and in the                            right colon.                           - One 5 mm polyp in the ascending colon, removed                            with a cold snare. Resected and retrieved.                           - Two 4 to 8 mm polyps in the transverse colon,                            removed with a cold snare. Resected and retrieved.                           - One 3 mm polyp in the descending colon, removed                            with a cold snare. Resected and retrieved.                           - Two 4 to 6 mm polyps in the sigmoid colon,                            removed with a cold snare. Resected and retrieved.                           - Internal hemorrhoids.                           - The examination was otherwise normal. Recommendation:           - Patient has a contact number available for                            emergencies. The signs and symptoms of potential                            delayed complications were discussed with the                            patient. Return to normal activities tomorrow.                            Written discharge instructions were provided to the  patient.                           - Resume previous diet.                           - Continue present medications.                           - Resume  Plavix tomorrow.                           - Await pathology results. Remo Lipps P. Jessamine Barcia, MD 06/03/2022 12:20:56 PM This report has been signed electronically.

## 2022-06-03 NOTE — Patient Instructions (Signed)
Resume Plavix tomorrow.   Await pathology results.  Handout on polyps, diverticulosis, and hemorrhoids given.  YOU HAD AN ENDOSCOPIC PROCEDURE TODAY AT [facility]:   Refer to the procedure report that was given to you for any specific questions about what was found during the examination.  If the procedure report does not answer your questions, please call your gastroenterologist to clarify.  If you requested that your care partner not be given the details of your procedure findings, then the procedure report has been included in a sealed envelope for you to review at your convenience later.  YOU SHOULD EXPECT: Some feelings of bloating in the abdomen. Passage of more gas than usual.  Walking can help get rid of the air that was put into your GI tract during the procedure and reduce the bloating. If you had a lower endoscopy (such as a colonoscopy or flexible sigmoidoscopy) you may notice spotting of blood in your stool or on the toilet paper. If you underwent a bowel prep for your procedure, you may not have a normal bowel movement for a few days.  Please Note:  You might notice some irritation and congestion in your nose or some drainage.  This is from the oxygen used during your procedure.  There is no need for concern and it should clear up in a day or so.  SYMPTOMS TO REPORT IMMEDIATELY:  Following lower endoscopy (colonoscopy or flexible sigmoidoscopy):  Excessive amounts of blood in the stool  Significant tenderness or worsening of abdominal pains  Swelling of the abdomen that is new, acute  Fever of 100F or higher  For urgent or emergent issues, a gastroenterologist can be reached at any hour by calling [phone]. Do not use MyChart messaging for urgent concerns.    DIET:  We do recommend a small meal at first, but then you may proceed to your regular diet.  Drink plenty of fluids but you should avoid alcoholic beverages for 24 hours.  ACTIVITY:  You should  plan to take it easy for the rest of today and you should NOT DRIVE or use heavy machinery until tomorrow (because of the sedation medicines used during the test).    FOLLOW UP: Our staff will call the number listed on your records the next business day following your procedure.  We will call around 7:15- 8:00 am to check on you and address any questions or concerns that you may have regarding the information given to you following your procedure. If we do not reach you, we will leave a message.  If you develop any symptoms (ie: fever, flu-like symptoms, shortness of breath, cough etc.) before then, please call [phone].  If you test positive for Covid 19 in the 2 weeks post procedure, please call and report this information to [place].    If any biopsies were taken you will be contacted by phone or by letter within the next 1-3 weeks.  Please call us at [phone] if you have not heard about the biopsies in 3 weeks.    SIGNATURES/CONFIDENTIALITY: You and/or your care partner have signed paperwork which will be entered into your electronic medical record.  These signatures attest to the fact that that the information above on your After Visit Summary has been reviewed and is understood.  Full responsibility of the confidentiality of this discharge information lies with you and/or your care-partner.

## 2022-06-03 NOTE — Progress Notes (Signed)
Report to PACU, RN, vss, BBS= Clear.  

## 2022-06-03 NOTE — Progress Notes (Signed)
[facility] History and Physical   Primary Care Physician:  [facility]   Reason for Procedure:   History of colon polyps  Plan:    colonoscopy     HPI: Zachary Calhoun is a 81 y.o. male  here for colonoscopy surveillance - 12 adenomas removed 06/2018. Patient denies any bowel symptoms at this time. No family history of colon cancer known. Otherwise feels well without any cardiopulmonary symptoms. Has been off Plavix 5 days. I have discussed risks / benefits and he wishes to proceed.   Past Medical History:  Diagnosis Date   AICD (automatic cardioverter/defibrillator) present    Pt states he only has loop recorder, 03/17/21   Arthritis    CVA (cerebral vascular accident) ([place])    DUE TO THROMBOSIS OF LEFT CEREBELLAR ARTERY   History of gout X 1   "left big toe"   History of kidney stones    "no OR" (10/05/2017)   HLD (hyperlipidemia)    Hypertension    Hypertriglyceridemia    Stroke [facility])     Past Surgical History:  Procedure Laterality Date   APPENDECTOMY     CARDIAC CATHETERIZATION     COLONOSCOPY     DR. Lyndel Safe   FLEXIBLE SIGMOIDOSCOPY  06/22/1993   WAS NORMAL TO 65 CMS   INGUINAL HERNIA REPAIR Bilateral    LOOP RECORDER INSERTION N/A 10/07/2017   Procedure: LOOP RECORDER INSERTION;  Surgeon: Constance Haw, MD;  Location: [place] [facility];  Service: Cardiovascular;  Laterality: N/A;   TEE WITHOUT CARDIOVERSION N/A 10/07/2017   Procedure: TRANSESOPHAGEAL ECHOCARDIOGRAM (TEE);  Surgeon: Dorothy Spark, MD;  Location: [facility] ENDOSCOPY;  Service: Cardiovascular;  Laterality: N/A;   TOTAL HIP ARTHROPLASTY Right 03/17/2021   Procedure: TOTAL HIP ARTHROPLASTY ANTERIOR APPROACH;  Surgeon: Renette Butters, MD;  Location: [facility];  Service: Orthopedics;  Laterality: Right;    Prior to Admission medications   Medication Sig Start Date End Date Taking? Authorizing Provider  atorvastatin (LIPITOR) 40 MG tablet Take 40 mg by mouth daily.  09/09/17  Yes [provider]  captopril (CAPOTEN) 50 MG tablet Take 50 mg by mouth 2 (two) times daily. 11/07/17  Yes [provider]  Multiple Vitamin (MULTIVITAMIN) tablet Take 1 tablet daily by mouth.   Yes [provider]  niacin 500 MG tablet Take 500 mg daily by mouth.   Yes [provider]  Omega-3 Fatty Acids (FISH OIL) 1200 MG CAPS Take 1,200 mg by mouth daily.   Yes [provider]  clopidogrel (PLAVIX) 75 MG tablet Take 1 tablet (75 mg total) by mouth daily. 10/08/17   Doreatha Lew, MD  methocarbamol (ROBAXIN) 500 MG tablet Take 1 tablet (500 mg total) by mouth every 8 (eight) hours as needed for muscle spasms. Patient not taking: Reported on 06/03/2022 03/17/21   Britt Bottom, PA-C  omeprazole (PRILOSEC OTC) 20 MG tablet Take 1 tablet (20 mg total) by mouth daily. For gastric protection 03/17/21 04/29/22  Britt Bottom, PA-C    Current Outpatient Medications  Medication Sig Dispense Refill   atorvastatin (LIPITOR) 40 MG tablet Take 40 mg by mouth daily.     captopril (CAPOTEN) 50 MG tablet Take 50 mg by mouth 2 (two) times daily.     Multiple Vitamin (MULTIVITAMIN) tablet Take 1 tablet daily by mouth.     niacin 500 MG tablet Take 500 mg daily by mouth.     Omega-3 Fatty Acids (FISH OIL) 1200  MG CAPS Take 1,200 mg by mouth daily.     clopidogrel (PLAVIX) 75 MG tablet Take 1 tablet (75 mg total) by mouth daily. 30 tablet 0   methocarbamol (ROBAXIN) 500 MG tablet Take 1 tablet (500 mg total) by mouth every 8 (eight) hours as needed for muscle spasms. (Patient not taking: Reported on 06/03/2022) 20 tablet 0   omeprazole (PRILOSEC OTC) 20 MG tablet Take 1 tablet (20 mg total) by mouth daily. For gastric protection 30 tablet 0   Current Facility-Administered Medications  Medication Dose Route Frequency Provider Last Rate Last Admin   0.9 %  sodium chloride infusion  500 mL Intravenous Continuous Cicero Noy, Carlota Raspberry, MD         Allergies as of 06/03/2022 - Review Complete 06/03/2022  Allergen Reaction Noted   Hydrochlorothiazide  01/09/2019    Family History  Problem Relation Age of Onset   Stroke Mother    Colon cancer Neg Hx    Stomach cancer Neg Hx     Social History   Socioeconomic History   Marital status: Married    Spouse name: Not on file   Number of children: Not on file   Years of education: Not on file   Highest education level: Not on file  Occupational History   Not on file  Tobacco Use   Smoking status: Never   Smokeless tobacco: Never  Vaping Use   Vaping Use: Never used  Substance and Sexual Activity   Alcohol use: No   Drug use: No   Sexual activity: Yes  Other Topics Concern   Not on file  Social History Narrative   Not on file   Social Determinants of Health   Financial Resource Strain: Not on file  Food Insecurity: Not on file  Transportation Needs: Not on file  Physical Activity: Not on file  Stress: Not on file  Social Connections: Not on file  Intimate Partner Violence: Not on file    Review of Systems: All other review of systems negative except as mentioned in the HPI.  Physical Exam: Vital signs BP (!) 170/80   Pulse (!) 52   Temp (!) 97.5 F (36.4 C)   Resp 18   Ht '5\' 7"'$  (1.702 m)   Wt 180 lb (81.6 kg)   SpO2 98%   BMI 28.19 kg/m   General:   Alert,  Well-developed, pleasant and cooperative in NAD Lungs:  Clear throughout to auscultation.   Heart:  Regular rate and rhythm Abdomen:  Soft, nontender and nondistended.   Neuro/Psych:  Alert and cooperative. Normal mood and affect. A and O x 3  Jolly Mango, MD [facility]

## 2022-06-04 ENCOUNTER — Telehealth: Payer: Self-pay | Admitting: *Deleted

## 2022-06-04 NOTE — Telephone Encounter (Signed)
  Follow up Call-     06/03/2022   10:49 AM  Call back number  Post procedure Call Back phone  # [phone]  Permission to leave phone message Yes     Patient questions:  Do you have a fever, pain , or abdominal swelling? No. Pain Score  0 *  Have you tolerated food without any problems? Yes.    Have you been able to return to your normal activities? Yes.    Do you have any questions about your discharge instructions: Diet   No. Medications  No. Follow up visit  No.  Do you have questions or concerns about your Care? No.  Actions: * If pain score is 4 or above: No action needed, pain <4.

## 2022-06-07 ENCOUNTER — Encounter: Payer: Self-pay | Admitting: Gastroenterology

## 2022-11-18 DIAGNOSIS — H26492 Other secondary cataract, left eye: Secondary | ICD-10-CM | POA: Diagnosis not present

## 2022-11-18 DIAGNOSIS — Z961 Presence of intraocular lens: Secondary | ICD-10-CM | POA: Diagnosis not present

## 2022-11-18 DIAGNOSIS — H18413 Arcus senilis, bilateral: Secondary | ICD-10-CM | POA: Diagnosis not present

## 2022-11-18 DIAGNOSIS — H02831 Dermatochalasis of right upper eyelid: Secondary | ICD-10-CM | POA: Diagnosis not present

## 2022-11-18 DIAGNOSIS — H34232 Retinal artery branch occlusion, left eye: Secondary | ICD-10-CM | POA: Diagnosis not present

## 2022-11-18 DIAGNOSIS — H26493 Other secondary cataract, bilateral: Secondary | ICD-10-CM | POA: Diagnosis not present

## 2022-12-02 DIAGNOSIS — H26491 Other secondary cataract, right eye: Secondary | ICD-10-CM | POA: Diagnosis not present

## 2023-01-05 NOTE — Progress Notes (Unsigned)
Cardiology Office Note Date:  01/05/2023  Patient ID:  Zachary Calhoun [birth year], MRN [id number] PCP:  [facility]  Cardiology: Dr. Gasper Sells Electrophysiologist: Dr. Curt Bears    Chief Complaint: check ILR sie  History of Present Illness: Zachary Calhoun is a 82 y.o. male with history of HTN, HLD, stroke, VHD (non-rheumatic mild AS)  He comes in today to be seen for Dr. Curt Bears, last seen by him Feb 2020 with some perceived vibration nearing his vibration near the loop recorder, no othe complaints.  Vibration had stopped and no issues were found.  Recommended echo in 5 years 2/2 mild AS.   I saw him March 2022 He does very well.  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  Says the dirt/field ground is much easier on his hip then hard surfaces. He is very active, denies any CP, palpitations or cardiac awareness. No dizzy spells, near syncope or syncope. No SOB or DOE RCRI is one, 0.9% risk No AF to date Felt a reasonable candidate for surgery Discussed surveillance echos for his mild AS  He saw Dr. Curt Bears 11/20/21, feeling well, loop was EOS, pt preferred to leave it in place  He saw Dr. Gasper Sells 02/15/22 to establish general cardiology care for his AS, he had no cardiac concerns or limitations, AS was mild.  Mentioned that he had decided to have his loop removed with plans to re-refer to Dr. Curt Bears for this.  I do not see that his loop was removed.  TODAY He feels good.  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 No CP, palpitations or cardiac awareness No SOB unless he has a steep hill No near syncope or syncope.  He reports that in the last year has developed a brown area by the loop and his PMD suggested we take a look.  It doesn't hurt, itch, or bother him  Device information MDT loop recorder implanted  10/07/2017 cryptogenic stoke >> known EOS   Past Medical History:  Diagnosis Date   AICD (automatic cardioverter/defibrillator) present    Pt states he only has loop recorder, 03/17/21   Arthritis    CVA (cerebral vascular accident) ([place])    DUE TO THROMBOSIS OF LEFT CEREBELLAR ARTERY   History of gout X 1   "left big toe"   History of kidney stones    "no OR" (10/05/2017)   HLD (hyperlipidemia)    Hypertension    Hypertriglyceridemia    Stroke [facility])     Past Surgical History:  Procedure Laterality Date   APPENDECTOMY     CARDIAC CATHETERIZATION     COLONOSCOPY     DR. Lyndel Safe   FLEXIBLE SIGMOIDOSCOPY  06/22/1993   WAS NORMAL TO 65 CMS   INGUINAL HERNIA REPAIR Bilateral    LOOP RECORDER INSERTION N/A 10/07/2017   Procedure: LOOP RECORDER INSERTION;  Surgeon: Constance Haw, MD;  Location: [place] [facility];  Service: Cardiovascular;  Laterality: N/A;   TEE WITHOUT CARDIOVERSION N/A 10/07/2017   Procedure: TRANSESOPHAGEAL ECHOCARDIOGRAM (TEE);  Surgeon: Dorothy Spark, MD;  Location: [facility] ENDOSCOPY;  Service: Cardiovascular;  Laterality: N/A;   TOTAL HIP ARTHROPLASTY Right 03/17/2021   Procedure: TOTAL HIP ARTHROPLASTY ANTERIOR APPROACH;  Surgeon: Renette Butters, MD;  Location: [facility];  Service: Orthopedics;  Laterality: Right;  Current Outpatient Medications  Medication Sig Dispense Refill   atorvastatin (LIPITOR) 40 MG tablet Take 40 mg by mouth daily.     captopril (CAPOTEN) 50 MG tablet Take 50 mg by mouth 2 (two) times daily.     clopidogrel (PLAVIX) 75 MG tablet Take 1 tablet (75 mg total) by mouth daily. 30 tablet 0   methocarbamol (ROBAXIN) 500 MG tablet Take 1 tablet (500 mg total) by mouth every 8 (eight) hours as needed for muscle spasms. (Patient not taking: Reported on 06/03/2022) 20 tablet 0   Multiple Vitamin (MULTIVITAMIN) tablet Take 1 tablet daily by mouth.     niacin 500 MG tablet Take 500 mg daily by mouth.     Omega-3 Fatty Acids (FISH OIL) 1200  MG CAPS Take 1,200 mg by mouth daily.     omeprazole (PRILOSEC OTC) 20 MG tablet Take 1 tablet (20 mg total) by mouth daily. For gastric protection 30 tablet 0   No current facility-administered medications for this visit.    Allergies:   Hydrochlorothiazide   Social History:  The patient  reports that he has never smoked. He has never used smokeless tobacco. He reports that he does not drink alcohol and does not use drugs.   Family History:  The patient's family history includes Stroke in his mother.  ROS:  Please see the history of present illness.    All other systems are reviewed and otherwise negative.   PHYSICAL EXAM:  VS:  There were no vitals taken for this visit. BMI: There is no height or weight on file to calculate BMI. Well nourished, well developed, in no acute distress HEENT: normocephalic, atraumatic Neck: no JVD, carotid bruits or masses Cardiac:  RRR; 1-2/6 SM, no rubs, or gallops Lungs: CTA b/l, no wheezing, rhonchi or rales Abd: soft, nontender MS: no deformity or atrophy Ext:  no edema Skin: warm and dry, no rash Neuro:  No gross deficits appreciated Psych: euthymic mood, full affect  ILR site is stable, no tethering or discomfort, there is a very superficial brown area irregular shaped, approx 1cm diameter with darker spots within in it  He has a smaller similar colored lesion upper abdomen.  Neither are raised or blanch   EKG:  not done today    12/10/21: TTE 1. Left ventricular ejection fraction, by estimation, is 65 to 70%. The  left ventricle has normal function. The left ventricle has no regional  wall motion abnormalities. Left ventricular diastolic parameters are  consistent with Grade I diastolic  dysfunction (impaired relaxation).   2. Right ventricular systolic function is normal. The right ventricular  size is normal. There is normal pulmonary artery systolic pressure. The  estimated right ventricular systolic pressure is 69.4 mmHg.   3.  The mitral valve is abnormal. Trivial mitral valve regurgitation.   4. The aortic valve is tricuspid. Aortic valve regurgitation is not  visualized. Mild aortic valve stenosis. Aortic valve area, by VTI measures  1.57 cm. Aortic valve mean gradient measures 15.0 mmHg. Aortic valve Vmax  measures 2.54 m/s. DI is 0.50.   5. The inferior vena cava is normal in size with greater than 50%  respiratory variability, suggesting right atrial pressure of 3 mmHg.   6. Cannot exclude a small PFO.   Comparison(s): Changes from prior study are noted. 10/06/2017: LVEF 55-60%,  mild AS - peak gradient 14 mmHg, AVA 1.87 cm2.   TTE 10/06/2017 Review of the above records today demonstrates:  - Left ventricle: The cavity  size was normal. Systolic function was   normal. The estimated ejection fraction was in the range of 55%   to 60%. Wall motion was normal; there were no regional wall   motion abnormalities. - Aortic valve: Trileaflet; mildly thickened, mildly calcified   leaflets. Valve area (Vmax): 1.87 cm^2. - Mitral valve: There was mild regurgitation. - Pulmonary arteries: Systolic pressure was mildly increased. PA   peak pressure: 31 mm Hg (S).  Recent Labs: No results found for requested labs within last 365 days.  No results found for requested labs within last 365 days.   CrCl cannot be calculated (Patient's most recent lab result is older than the maximum 21 days allowed.).   Wt Readings from Last 3 Encounters:  06/03/22 180 lb (81.6 kg)  04/27/22 180 lb (81.6 kg)  02/15/22 183 lb 9.6 oz (83.3 kg)     Other studies reviewed: Additional studies/records reviewed today include: summarized above  ASSESSMENT AND PLAN:  1. Cryptogenic stroke 2. Loop implant     EOS Sept 2022, no AFib found  Skin lesion is superficial, not associated with loop, has a much smaller but similarly colored lesion on his upper abdomen as well.  Suggested dermatology evaluation, not associated with the  loop.  He would prefer then to just leave the loop in place, does not bother him.  3. HTN     Home numbers 120-130/70-80     Repeat by myself is 140/88  4. Soft SM on exam     AV sclerosis/mild AS       No symptoms to suggest clinical change, has excellent exertional capacity     Sees Dr. Gasper Sells in May   Disposition: EP can see him PRN    Current medicines are reviewed at length with the patient today.  The patient did not have any concerns regarding medicines.  Venetia Night, PA-C 01/05/2023 7:55 AM     [place] [address] [phone] (office)  [phone] (fax)

## 2023-01-06 ENCOUNTER — Ambulatory Visit: Payer: Medicare Other | Attending: Physician Assistant | Admitting: Physician Assistant

## 2023-01-06 ENCOUNTER — Encounter: Payer: Self-pay | Admitting: Physician Assistant

## 2023-01-06 VITALS — BP 154/86 | HR 60 | Ht 67.0 in | Wt 183.2 lb

## 2023-01-06 DIAGNOSIS — I350 Nonrheumatic aortic (valve) stenosis: Secondary | ICD-10-CM | POA: Diagnosis not present

## 2023-01-06 DIAGNOSIS — Z95818 Presence of other cardiac implants and grafts: Secondary | ICD-10-CM | POA: Diagnosis not present

## 2023-01-06 DIAGNOSIS — I10 Essential (primary) hypertension: Secondary | ICD-10-CM

## 2023-01-06 NOTE — Patient Instructions (Signed)
Medication Instructions:   Your physician recommends that you continue on your current medications as directed. Please refer to the Current Medication list given to you today.  *If you need a refill on your cardiac medications before your next appointment, please call your pharmacy*   Lab Work:  [place]    If you have labs (blood work) drawn today and your tests are completely normal, you will receive your results only by: [place] (if you have MyChart) OR A paper copy in the mail If you have any lab test that is abnormal or we need to change your treatment, we will call you to review the results.   Testing/Procedures: NONE ORDERED  TODAY    Follow-Up: At [facility], you and your health needs are our priority.  As part of our continuing mission to provide you with exceptional heart care, we have created designated Provider Care Teams.  These Care Teams include your primary Cardiologist (physician) and Advanced Practice Providers (APPs -  Physician Assistants and Nurse Practitioners) who all work together to provide you with the care you need, when you need it.  We recommend signing up for the patient portal called "MyChart".  Sign up information is provided on this After Visit Summary.  MyChart is used to connect with patients for Virtual Visits (Telemedicine).  Patients are able to view lab/test results, encounter notes, upcoming appointments, etc.  Non-urgent messages can be sent to your provider as well.   To learn more about what you can do with MyChart, go to [email or web address].    Your next appointment:  AS SCHEDULED   3 month(s)  Provider:   Werner Lean, MD    Other Instructions

## 2023-01-21 DIAGNOSIS — H60393 Other infective otitis externa, bilateral: Secondary | ICD-10-CM | POA: Diagnosis not present

## 2023-01-21 DIAGNOSIS — I6930 Unspecified sequelae of cerebral infarction: Secondary | ICD-10-CM | POA: Diagnosis not present

## 2023-01-21 DIAGNOSIS — I672 Cerebral atherosclerosis: Secondary | ICD-10-CM | POA: Diagnosis not present

## 2023-01-21 DIAGNOSIS — Z0000 Encounter for general adult medical examination without abnormal findings: Secondary | ICD-10-CM | POA: Diagnosis not present

## 2023-01-21 DIAGNOSIS — E1169 Type 2 diabetes mellitus with other specified complication: Secondary | ICD-10-CM | POA: Diagnosis not present

## 2023-01-21 DIAGNOSIS — N1831 Chronic kidney disease, stage 3a: Secondary | ICD-10-CM | POA: Diagnosis not present

## 2023-01-21 DIAGNOSIS — E782 Mixed hyperlipidemia: Secondary | ICD-10-CM | POA: Diagnosis not present

## 2023-01-21 DIAGNOSIS — H547 Unspecified visual loss: Secondary | ICD-10-CM | POA: Diagnosis not present

## 2023-01-21 DIAGNOSIS — I10 Essential (primary) hypertension: Secondary | ICD-10-CM | POA: Diagnosis not present

## 2023-01-21 DIAGNOSIS — E785 Hyperlipidemia, unspecified: Secondary | ICD-10-CM | POA: Diagnosis not present

## 2023-01-21 DIAGNOSIS — I69398 Other sequelae of cerebral infarction: Secondary | ICD-10-CM | POA: Diagnosis not present

## 2023-01-21 DIAGNOSIS — Z79899 Other long term (current) drug therapy: Secondary | ICD-10-CM | POA: Diagnosis not present

## 2023-04-08 ENCOUNTER — Ambulatory Visit: Payer: Medicare Other | Attending: Internal Medicine | Admitting: Internal Medicine

## 2023-04-08 ENCOUNTER — Encounter: Payer: Self-pay | Admitting: Internal Medicine

## 2023-04-08 VITALS — BP 110/54 | HR 58 | Ht 66.0 in | Wt 182.6 lb

## 2023-04-08 DIAGNOSIS — I350 Nonrheumatic aortic (valve) stenosis: Secondary | ICD-10-CM

## 2023-04-08 DIAGNOSIS — E785 Hyperlipidemia, unspecified: Secondary | ICD-10-CM

## 2023-04-08 DIAGNOSIS — I10 Essential (primary) hypertension: Secondary | ICD-10-CM

## 2023-04-08 NOTE — Patient Instructions (Signed)
Medication Instructions:  Your physician recommends that you continue on your current medications as directed. Please refer to the Current Medication list given to you today.  *If you need a refill on your cardiac medications before your next appointment, please call your pharmacy*   Lab Work: NONE If you have labs (blood work) drawn today and your tests are completely normal, you will receive your results only by: MyChart Message (if you have MyChart) OR A paper copy in the mail If you have any lab test that is abnormal or we need to change your treatment, we will call you to review the results.   Testing/Procedures: APRIL 2025: Your physician has requested that you have an echocardiogram. Echocardiography is a painless test that uses sound waves to create images of your heart. It provides your doctor with information about the size and shape of your heart and how well your heart's chambers and valves are working. This procedure takes approximately one hour. There are no restrictions for this procedure. Please do NOT wear cologne, perfume, aftershave, or lotions (deodorant is allowed). Please arrive 15 minutes prior to your appointment time.    Follow-Up: At [facility], you and your health needs are our priority.  As part of our continuing mission to provide you with exceptional heart care, we have created designated Provider Care Teams.  These Care Teams include your primary Cardiologist (physician) and Advanced Practice Providers (APPs -  Physician Assistants and Nurse Practitioners) who all work together to provide you with the care you need, when you need it.  We recommend signing up for the patient portal called "MyChart".  Sign up information is provided on this After Visit Summary.  MyChart is used to connect with patients for Virtual Visits (Telemedicine).  Patients are able to view lab/test results, encounter notes, upcoming appointments, etc.  Non-urgent messages can be  sent to your provider as well.   To learn more about what you can do with MyChart, go to [email or web address].    Your next appointment:   1 year(s)  Provider:   Riley Lam, MD

## 2023-04-08 NOTE — Progress Notes (Signed)
Cardiology Office Note:    Date:  04/08/2023   ID:  Zachary Calhoun, DOB Oct 13, 1941, MRN [id number]  PCP:  [facility]   [facility] Providers Cardiologist:  Christell Constant, MD     Referring MD: Physicians, [facility]yln [facility]*   CC: AS follow up  History of Present Illness:    Zachary Calhoun is a 82 y.o. male with a hx of prior embolic stroke, HTN and HLD, LINQ 2018 and mild AS 12/10/21 who presents to establish with gen cards. 2023: Able to bale hay, asymptomatic from AS.  Mean gradient 15 mm Hg.  Had uncomplicated non cardiac procedure.  Patient notes that he is doing well.   Since last visit notes no changes . There are no interval hospital/ED visit.    No chest pain or pressure .  No SOB; rare DOE with exertion,  and no PND/Orthopnea.  No weight gain or leg swelling.  No palpitations or syncope .   Past Medical History:  Diagnosis Date   AICD (automatic cardioverter/defibrillator) present    Pt states he only has loop recorder, 03/17/21   Arthritis    CVA (cerebral vascular accident) (HCC)    DUE TO THROMBOSIS OF LEFT CEREBELLAR ARTERY   History of gout X 1   "left big toe"   History of kidney stones    "no OR" (10/05/2017)   HLD (hyperlipidemia)    Hypertension    Hypertriglyceridemia    Stroke [facility])     Past Surgical History:  Procedure Laterality Date   APPENDECTOMY     CARDIAC CATHETERIZATION     COLONOSCOPY     DR. Chales Abrahams   FLEXIBLE SIGMOIDOSCOPY  06/22/1993   WAS NORMAL TO 65 CMS   INGUINAL HERNIA REPAIR Bilateral    LOOP RECORDER INSERTION N/A 10/07/2017   Procedure: LOOP RECORDER INSERTION;  Surgeon: Regan Lemming, MD;  Location: [facility] INVASIVE CV LAB;  Service: Cardiovascular;  Laterality: N/A;   TEE WITHOUT CARDIOVERSION N/A 10/07/2017   Procedure: TRANSESOPHAGEAL ECHOCARDIOGRAM (TEE);  Surgeon: Lars Masson, MD;  Location: [facility] ENDOSCOPY;  Service: Cardiovascular;  Laterality: N/A;   TOTAL HIP ARTHROPLASTY Right 03/17/2021    Procedure: TOTAL HIP ARTHROPLASTY ANTERIOR APPROACH;  Surgeon: Sheral Apley, MD;  Location: [facility] ORS;  Service: Orthopedics;  Laterality: Right;    Current Medications: Current Meds  Medication Sig   atorvastatin (LIPITOR) 40 MG tablet Take 40 mg by mouth daily.   captopril (CAPOTEN) 50 MG tablet Take 50 mg by mouth 2 (two) times daily.   clopidogrel (PLAVIX) 75 MG tablet Take 1 tablet (75 mg total) by mouth daily.   Multiple Vitamin (MULTIVITAMIN) tablet Take 1 tablet daily by mouth.   niacin 500 MG tablet Take 500 mg daily by mouth.   Omega-3 Fatty Acids (FISH OIL) 1200 MG CAPS Take 1,200 mg by mouth daily.   [DISCONTINUED] methocarbamol (ROBAXIN) 500 MG tablet Take 1 tablet (500 mg total) by mouth every 8 (eight) hours as needed for muscle spasms.     Allergies:   Hydrochlorothiazide   Social History   Socioeconomic History   Marital status: Married    Spouse name: Not on file   Number of children: Not on file   Years of education: Not on file   Highest education level: Not on file  Occupational History   Not on file  Tobacco Use   Smoking status: Never   Smokeless tobacco: Never  Vaping Use   Vaping Use:  Never used  Substance and Sexual Activity   Alcohol use: No   Drug use: No   Sexual activity: Yes  Other Topics Concern   Not on file  Social History Narrative   Not on file   Social Determinants of Health   Financial Resource Strain: Not on file  Food Insecurity: Not on file  Transportation Needs: Not on file  Physical Activity: Not on file  Stress: Not on file  Social Connections: Not on file    Family History: The patient's family history includes Stroke in his mother. There is no history of Colon cancer or Stomach cancer.  ROS:   Please see the history of present illness.     All other systems reviewed and are negative.  EKGs/Labs/Other Studies Reviewed:    The following studies were reviewed today:  EKG:   02/15/22: Sinus bradycardia  RBBB  Cardiac Studies & Procedures       ECHOCARDIOGRAM  ECHOCARDIOGRAM COMPLETE 12/10/2021  Narrative ECHOCARDIOGRAM REPORT    Patient Name:   Zachary Calhoun Date of Exam: 12/10/2021 Medical Rec #:  [id number]      Height:       68.0 in Accession #:    [id number]     Weight:       179.4 lb Date of Birth:  1941/06/17      BSA:          1.951 m Patient Age:    80 years       BP:           136/88 mmHg Patient Gender: M              HR:           71 bpm. Exam Location:  [address]  Procedure: 2D Echo, Cardiac Doppler and Color Doppler  Indications:    I35.0 Aortic Stenosis  History:        Patient has prior history of Echocardiogram examinations, most recent 10/06/2017. Defibrillator, Stroke; Risk Factors:Hypertension and Dyslipidemia.  Sonographer:    Daphine Deutscher RDCS Referring Phys: [id number] WILL MARTIN CAMNITZ  IMPRESSIONS   1. Left ventricular ejection fraction, by estimation, is 65 to 70%. The left ventricle has normal function. The left ventricle has no regional wall motion abnormalities. Left ventricular diastolic parameters are consistent with Grade I diastolic dysfunction (impaired relaxation). 2. Right ventricular systolic function is normal. The right ventricular size is normal. There is normal pulmonary artery systolic pressure. The estimated right ventricular systolic pressure is 28.6 mmHg. 3. The mitral valve is abnormal. Trivial mitral valve regurgitation. 4. The aortic valve is tricuspid. Aortic valve regurgitation is not visualized. Mild aortic valve stenosis. Aortic valve area, by VTI measures 1.57 cm. Aortic valve mean gradient measures 15.0 mmHg. Aortic valve Vmax measures 2.54 m/s. DI is 0.50. 5. The inferior vena cava is normal in size with greater than 50% respiratory variability, suggesting right atrial pressure of 3 mmHg. 6. Cannot exclude a small PFO.  Comparison(s): Changes from prior study are noted. 10/06/2017: LVEF 55-60%, mild AS - peak  gradient 14 mmHg, AVA 1.87 cm2.  FINDINGS Left Ventricle: Left ventricular ejection fraction, by estimation, is 65 to 70%. The left ventricle has normal function. The left ventricle has no regional wall motion abnormalities. The left ventricular internal cavity size was normal in size. There is no left ventricular hypertrophy. Left ventricular diastolic parameters are consistent with Grade I diastolic dysfunction (impaired relaxation). Indeterminate filling pressures.  Right Ventricle: The right ventricular  size is normal. No increase in right ventricular wall thickness. Right ventricular systolic function is normal. There is normal pulmonary artery systolic pressure. The tricuspid regurgitant velocity is 2.53 m/s, and with an assumed right atrial pressure of 3 mmHg, the estimated right ventricular systolic pressure is 28.6 mmHg.  Left Atrium: Left atrial size was normal in size.  Right Atrium: Right atrial size was normal in size.  Pericardium: There is no evidence of pericardial effusion.  Mitral Valve: The mitral valve is abnormal. There is moderate calcification of the posterior and anterior mitral valve leaflet(s). Mild to moderate mitral annular calcification. Trivial mitral valve regurgitation.  Tricuspid Valve: The tricuspid valve is grossly normal. Tricuspid valve regurgitation is trivial.  Aortic Valve: The aortic valve is tricuspid. Aortic valve regurgitation is not visualized. Mild aortic stenosis is present. Aortic valve mean gradient measures 15.0 mmHg. Aortic valve peak gradient measures 25.9 mmHg. Aortic valve area, by VTI measures 1.57 cm.  Pulmonic Valve: The pulmonic valve was grossly normal. Pulmonic valve regurgitation is trivial.  Aorta: The aortic root and ascending aorta are structurally normal, with no evidence of dilitation.  Venous: The inferior vena cava is normal in size with greater than 50% respiratory variability, suggesting right atrial pressure of 3  mmHg.  IAS/Shunts: Cannot exclude a small PFO.   LEFT VENTRICLE PLAX 2D LVIDd:         4.80 cm   Diastology LVIDs:         2.90 cm   LV e' medial:    8.05 cm/s LV PW:         0.90 cm   LV E/e' medial:  11.4 LV IVS:        0.70 cm   LV e' lateral:   6.64 cm/s LVOT diam:     2.00 cm   LV E/e' lateral: 13.9 LV SV:         90 LV SV Index:   46 LVOT Area:     3.14 cm   RIGHT VENTRICLE RV Basal diam:  4.30 cm RV S prime:     18.90 cm/s TAPSE (M-mode): 2.6 cm  LEFT ATRIUM             Index        RIGHT ATRIUM           Index LA diam:        4.20 cm 2.15 cm/m   RA Area:     14.40 cm LA Vol (A2C):   48.4 ml 24.80 ml/m  RA Volume:   33.70 ml  17.27 ml/m LA Vol (A4C):   43.2 ml 22.14 ml/m LA Biplane Vol: 45.9 ml 23.52 ml/m AORTIC VALVE AV Area (Vmax):    1.70 cm AV Area (Vmean):   1.50 cm AV Area (VTI):     1.57 cm AV Vmax:           254.40 cm/s AV Vmean:          181.600 cm/s AV VTI:            0.572 m AV Peak Grad:      25.9 mmHg AV Mean Grad:      15.0 mmHg LVOT Vmax:         138.00 cm/s LVOT Vmean:        86.600 cm/s LVOT VTI:          0.286 m LVOT/AV VTI ratio: 0.50  AORTA Ao Root diam: 3.30 cm Ao Asc diam:  3.40  cm  MITRAL VALVE                TRICUSPID VALVE MV Area (PHT): 3.17 cm     TR Peak grad:   25.6 mmHg MV Decel Time: 239 msec     TR Vmax:        253.00 cm/s MV E velocity: 92.00 cm/s MV A velocity: 115.00 cm/s  SHUNTS MV E/A ratio:  0.80         Systemic VTI:  0.29 m Systemic Diam: 2.00 cm  Zoila Shutter MD Electronically signed by Zoila Shutter MD Signature Date/Time: 12/10/2021/11:20:32 AM    Final   TEE  ECHO TEE 10/12/2017  Narrative *Home* *[facility]* [address] [place], [place] [address] [phone]  ------------------------------------------------------------------- Transesophageal Echocardiography  (Report amended )  Patient:    Zachary Calhoun, Zachary Calhoun MR #:       [id number] Study Date:  10/07/2017 Gender:     M Age:        51 Height:     172.7 cm Weight:     82.7 kg BSA:        2.01 m^2 Pt. Status: Room:       3W27C  ADMITTING    [place] PERFORMING   Tobias Alexander, M.D. SONOGRAPHER  Leta Jungling, RDCS ATTENDING    Randel Pigg, Maximino Sarin     [facility], Roe Rutherford REFERRING    Marcelino Duster  cc:  ------------------------------------------------------------------- LV EF: 55% -   60%  ------------------------------------------------------------------- Indications:      CVA 436.  ------------------------------------------------------------------- History:   Risk factors:  Hypertension. Dyslipidemia.  ------------------------------------------------------------------- Study Conclusions  - Left ventricle: Systolic function was normal. The estimated ejection fraction was in the range of 55% to 60%. Wall motion was normal; there were no regional wall motion abnormalities. - Left atrium: The atrium was dilated. No evidence of thrombus in the atrial cavity or appendage. No evidence of thrombus in the atrial cavity or appendage. - Right ventricle: Systolic function was normal. - Right atrium: No evidence of thrombus in the atrial cavity or appendage. - Atrial septum: No defect or patent foramen ovale was identified. - Pericardium, extracardiac: There was no pericardial effusion.  Impressions:  - No cardiac source of emboli was indentified. Negative bubble study. No PFO.  ------------------------------------------------------------------- Study data:   Study status:  Routine.  Consent:  The risks, benefits, and alternatives to the procedure were explained to the patient and informed consent was obtained.  Procedure:  The patient reported no pain pre or post test. Initial setup. The patient was brought to the laboratory. Surface ECG leads were monitored. Sedation. General anesthesia was administered by anesthesiology staff.  Transesophageal echocardiography. A transesophageal probe was inserted by the attending cardiologist. Image quality was adequate. Intravenous contrast (agitated saline) was administered. Study completion:  The patient tolerated the procedure well. There were no complications.          Diagnostic transesophageal echocardiography.  2D and color Doppler.  Birthdate:  Patient birthdate: [birth year].  Age:  Patient is 82 yr old.  Sex:  Gender: male.    BMI: 27.7 kg/m^2.  Blood pressure:     120/53  Patient status:  Inpatient.  Study date:  Study date: 10/07/2017. Study time: 12:01 PM.  Location:  Endoscopy.  -------------------------------------------------------------------  ------------------------------------------------------------------- Left ventricle:  Systolic function was normal. The estimated ejection fraction was in the range of 55% to 60%. Wall motion was normal; there were no regional  wall motion abnormalities.  ------------------------------------------------------------------- Aortic valve:   Trileaflet; moderately thickened, moderately calcified leaflets. Cusp separation was normal.  Doppler:  There was no significant regurgitation.  ------------------------------------------------------------------- Aorta:  There was mild non-mobile atheroma. There was no evidence for dissection. Aortic root: The aortic root was not dilated. Ascending aorta: The ascending aorta was normal in size. Descending aorta: The descending aorta was normal in size.  ------------------------------------------------------------------- Mitral valve:   Structurally normal valve.   Leaflet separation was normal.  Doppler:  There was no significant regurgitation.  ------------------------------------------------------------------- Left atrium:  The atrium was dilated.  No evidence of thrombus in the atrial cavity or appendage.  No evidence of thrombus in the atrial cavity or appendage. The appendage  was morphologically a left appendage, multilobulated, and of normal size. Emptying velocity was normal.  ------------------------------------------------------------------- Atrial septum:  No defect or patent foramen ovale was identified.  ------------------------------------------------------------------- Right ventricle:  The cavity size was normal. Wall thickness was normal. Systolic function was normal.  ------------------------------------------------------------------- Pulmonic valve:    Structurally normal valve.    Doppler:  There was trivial regurgitation.  ------------------------------------------------------------------- Tricuspid valve:   Structurally normal valve.   Leaflet separation was normal.  Doppler:  There was mild regurgitation.  ------------------------------------------------------------------- Pulmonary artery:   The main pulmonary artery was normal-sized.  ------------------------------------------------------------------- Right atrium:  The atrium was normal in size.  No evidence of thrombus in the atrial cavity or appendage. The appendage was morphologically a right appendage.  ------------------------------------------------------------------- Pericardium:  There was no pericardial effusion.  ------------------------------------------------------------------- Quay Burow, M.D. 2018-11-07T10:09:56             Recent Labs: No results found for requested labs within last 365 days.  Recent Lipid Panel    Component Value Date/Time   CHOL 87 10/06/2017 0342   TRIG 171 (H) 10/06/2017 0342   HDL 25 (L) 10/06/2017 0342   CHOLHDL 3.5 10/06/2017 0342   VLDL 34 10/06/2017 0342   LDLCALC 28 10/06/2017 0342       Physical Exam:    VS:  BP (!) 110/54   Pulse (!) 58   Ht 5\' 6"  (1.676 m)   Wt 182 lb 9.6 oz (82.8 kg)   SpO2 96%   BMI 29.47 kg/m     Wt Readings from Last 3 Encounters:  04/08/23 182 lb 9.6 oz (82.8 kg)  01/06/23  183 lb 3.2 oz (83.1 kg)  06/03/22 180 lb (81.6 kg)    Gen: no distress Neck: No JVD Cardiac: No Rubs or Gallops, systolic ejection murmur,  regular bradycardia, +2 radial pulses Respiratory: Clear to auscultation bilaterally, normal effort, normal  respiratory rate GI: Soft, nontender, non-distended  MS: Trace bilateral  edema;  moves all extremities Integument: Skin feels warm Neuro:  At time of evaluation, alert and oriented to person/place/time/situation  Psych: Normal affect, patient feels great   ASSESSMENT:    1. Nonrheumatic aortic valve stenosis   2. Hypertension, unspecified type   3. Hyperlipidemia, unspecified hyperlipidemia type     PLAN:    Cryptogenic stroke HLD - plavix is reasonable given prior stroke - continue current therapy LDL goal < 70  HLD - stable on current regimen - continue ambulatory BP  Mild AS - mean gradient 15 mm Hg. - we have reviewed red flag symptoms for this disease - echo in one year   RBBB  monitor  Follow up in one year me or APP       Medication Adjustments/Labs and Tests Ordered:  Current medicines are reviewed at length with the patient today.  Concerns regarding medicines are outlined above.  Orders Placed This Encounter  Procedures   EKG 12-Lead   ECHOCARDIOGRAM COMPLETE   No orders of the defined types were placed in this encounter.   Patient Instructions  Medication Instructions:  Your physician recommends that you continue on your current medications as directed. Please refer to the Current Medication list given to you today.  *If you need a refill on your cardiac medications before your next appointment, please call your pharmacy*   Lab Work: NONE If you have labs (blood work) drawn today and your tests are completely normal, you will receive your results only by: MyChart Message (if you have MyChart) OR A paper copy in the mail If you have any lab test that is abnormal or we need to change your  treatment, we will call you to review the results.   Testing/Procedures: APRIL 2025: Your physician has requested that you have an echocardiogram. Echocardiography is a painless test that uses sound waves to create images of your heart. It provides your doctor with information about the size and shape of your heart and how well your heart's chambers and valves are working. This procedure takes approximately one hour. There are no restrictions for this procedure. Please do NOT wear cologne, perfume, aftershave, or lotions (deodorant is allowed). Please arrive 15 minutes prior to your appointment time.    Follow-Up: At [facility], you and your health needs are our priority.  As part of our continuing mission to provide you with exceptional heart care, we have created designated Provider Care Teams.  These Care Teams include your primary Cardiologist (physician) and Advanced Practice Providers (APPs -  Physician Assistants and Nurse Practitioners) who all work together to provide you with the care you need, when you need it.  We recommend signing up for the patient portal called "MyChart".  Sign up information is provided on this After Visit Summary.  MyChart is used to connect with patients for Virtual Visits (Telemedicine).  Patients are able to view lab/test results, encounter notes, upcoming appointments, etc.  Non-urgent messages can be sent to your provider as well.   To learn more about what you can do with MyChart, go to [email or web address].    Your next appointment:   1 year(s)  Provider:   Riley Lam, MD       Signed, Christell Constant, MD  04/08/2023 12:30 PM    [facility]

## 2023-05-11 DIAGNOSIS — M109 Gout, unspecified: Secondary | ICD-10-CM | POA: Diagnosis not present

## 2023-07-04 DIAGNOSIS — H60393 Other infective otitis externa, bilateral: Secondary | ICD-10-CM | POA: Diagnosis not present

## 2023-07-04 DIAGNOSIS — H9201 Otalgia, right ear: Secondary | ICD-10-CM | POA: Diagnosis not present

## 2023-07-04 DIAGNOSIS — R42 Dizziness and giddiness: Secondary | ICD-10-CM | POA: Diagnosis not present

## 2023-08-04 DIAGNOSIS — E1169 Type 2 diabetes mellitus with other specified complication: Secondary | ICD-10-CM | POA: Diagnosis not present

## 2023-08-04 DIAGNOSIS — H8122 Vestibular neuronitis, left ear: Secondary | ICD-10-CM | POA: Diagnosis not present

## 2023-08-04 DIAGNOSIS — H60393 Other infective otitis externa, bilateral: Secondary | ICD-10-CM | POA: Diagnosis not present

## 2023-08-04 DIAGNOSIS — E785 Hyperlipidemia, unspecified: Secondary | ICD-10-CM | POA: Diagnosis not present

## 2023-08-04 DIAGNOSIS — I672 Cerebral atherosclerosis: Secondary | ICD-10-CM | POA: Diagnosis not present

## 2023-08-04 DIAGNOSIS — I6930 Unspecified sequelae of cerebral infarction: Secondary | ICD-10-CM | POA: Diagnosis not present

## 2023-11-08 DIAGNOSIS — Z8673 Personal history of transient ischemic attack (TIA), and cerebral infarction without residual deficits: Secondary | ICD-10-CM | POA: Diagnosis not present

## 2023-11-08 DIAGNOSIS — R0981 Nasal congestion: Secondary | ICD-10-CM | POA: Diagnosis not present

## 2023-11-08 DIAGNOSIS — E860 Dehydration: Secondary | ICD-10-CM | POA: Diagnosis not present

## 2023-11-08 DIAGNOSIS — I10 Essential (primary) hypertension: Secondary | ICD-10-CM | POA: Diagnosis not present

## 2023-11-08 DIAGNOSIS — Z743 Need for continuous supervision: Secondary | ICD-10-CM | POA: Diagnosis not present

## 2023-11-08 DIAGNOSIS — R42 Dizziness and giddiness: Secondary | ICD-10-CM | POA: Diagnosis not present

## 2023-11-08 DIAGNOSIS — Z7902 Long term (current) use of antithrombotics/antiplatelets: Secondary | ICD-10-CM | POA: Diagnosis not present

## 2023-11-08 DIAGNOSIS — G9340 Encephalopathy, unspecified: Secondary | ICD-10-CM | POA: Diagnosis not present

## 2023-11-08 DIAGNOSIS — Z79899 Other long term (current) drug therapy: Secondary | ICD-10-CM | POA: Diagnosis not present

## 2023-11-08 DIAGNOSIS — A4189 Other specified sepsis: Secondary | ICD-10-CM | POA: Diagnosis not present

## 2023-11-08 DIAGNOSIS — R9431 Abnormal electrocardiogram [ECG] [EKG]: Secondary | ICD-10-CM | POA: Diagnosis not present

## 2023-11-08 DIAGNOSIS — R059 Cough, unspecified: Secondary | ICD-10-CM | POA: Diagnosis not present

## 2023-11-08 DIAGNOSIS — A419 Sepsis, unspecified organism: Secondary | ICD-10-CM | POA: Diagnosis not present

## 2023-11-08 DIAGNOSIS — B9789 Other viral agents as the cause of diseases classified elsewhere: Secondary | ICD-10-CM | POA: Diagnosis not present

## 2023-11-08 DIAGNOSIS — R5383 Other fatigue: Secondary | ICD-10-CM | POA: Diagnosis not present

## 2023-11-08 DIAGNOSIS — R410 Disorientation, unspecified: Secondary | ICD-10-CM | POA: Diagnosis not present

## 2023-11-08 DIAGNOSIS — R0602 Shortness of breath: Secondary | ICD-10-CM | POA: Diagnosis not present

## 2023-11-08 DIAGNOSIS — N179 Acute kidney failure, unspecified: Secondary | ICD-10-CM | POA: Diagnosis not present

## 2023-11-08 DIAGNOSIS — R093 Abnormal sputum: Secondary | ICD-10-CM | POA: Diagnosis not present

## 2023-11-08 DIAGNOSIS — R509 Fever, unspecified: Secondary | ICD-10-CM | POA: Diagnosis not present

## 2023-11-08 DIAGNOSIS — I4510 Unspecified right bundle-branch block: Secondary | ICD-10-CM | POA: Diagnosis not present

## 2023-11-09 DIAGNOSIS — E860 Dehydration: Secondary | ICD-10-CM | POA: Diagnosis not present

## 2023-11-09 DIAGNOSIS — A4189 Other specified sepsis: Secondary | ICD-10-CM | POA: Diagnosis not present

## 2023-11-09 DIAGNOSIS — G9340 Encephalopathy, unspecified: Secondary | ICD-10-CM | POA: Diagnosis not present

## 2023-11-23 DIAGNOSIS — E1169 Type 2 diabetes mellitus with other specified complication: Secondary | ICD-10-CM | POA: Diagnosis not present

## 2023-11-23 DIAGNOSIS — Z2821 Immunization not carried out because of patient refusal: Secondary | ICD-10-CM | POA: Diagnosis not present

## 2023-11-23 DIAGNOSIS — N1831 Chronic kidney disease, stage 3a: Secondary | ICD-10-CM | POA: Diagnosis not present

## 2023-11-23 DIAGNOSIS — I10 Essential (primary) hypertension: Secondary | ICD-10-CM | POA: Diagnosis not present

## 2023-11-23 DIAGNOSIS — R590 Localized enlarged lymph nodes: Secondary | ICD-10-CM | POA: Diagnosis not present

## 2023-11-23 DIAGNOSIS — E782 Mixed hyperlipidemia: Secondary | ICD-10-CM | POA: Diagnosis not present

## 2023-11-23 DIAGNOSIS — E785 Hyperlipidemia, unspecified: Secondary | ICD-10-CM | POA: Diagnosis not present

## 2023-12-01 DIAGNOSIS — R590 Localized enlarged lymph nodes: Secondary | ICD-10-CM | POA: Diagnosis not present

## 2023-12-02 DIAGNOSIS — R590 Localized enlarged lymph nodes: Secondary | ICD-10-CM | POA: Diagnosis not present

## 2023-12-21 DIAGNOSIS — E782 Mixed hyperlipidemia: Secondary | ICD-10-CM | POA: Diagnosis not present

## 2023-12-21 DIAGNOSIS — E785 Hyperlipidemia, unspecified: Secondary | ICD-10-CM | POA: Diagnosis not present

## 2023-12-21 DIAGNOSIS — Z0000 Encounter for general adult medical examination without abnormal findings: Secondary | ICD-10-CM | POA: Diagnosis not present

## 2023-12-21 DIAGNOSIS — M8580 Other specified disorders of bone density and structure, unspecified site: Secondary | ICD-10-CM | POA: Diagnosis not present

## 2023-12-21 DIAGNOSIS — H547 Unspecified visual loss: Secondary | ICD-10-CM | POA: Diagnosis not present

## 2023-12-21 DIAGNOSIS — I672 Cerebral atherosclerosis: Secondary | ICD-10-CM | POA: Diagnosis not present

## 2023-12-21 DIAGNOSIS — Z79899 Other long term (current) drug therapy: Secondary | ICD-10-CM | POA: Diagnosis not present

## 2023-12-21 DIAGNOSIS — I10 Essential (primary) hypertension: Secondary | ICD-10-CM | POA: Diagnosis not present

## 2023-12-21 DIAGNOSIS — I6930 Unspecified sequelae of cerebral infarction: Secondary | ICD-10-CM | POA: Diagnosis not present

## 2023-12-21 DIAGNOSIS — I69398 Other sequelae of cerebral infarction: Secondary | ICD-10-CM | POA: Diagnosis not present

## 2023-12-21 DIAGNOSIS — E1169 Type 2 diabetes mellitus with other specified complication: Secondary | ICD-10-CM | POA: Diagnosis not present

## 2023-12-21 DIAGNOSIS — I70211 Atherosclerosis of native arteries of extremities with intermittent claudication, right leg: Secondary | ICD-10-CM | POA: Diagnosis not present

## 2024-03-12 ENCOUNTER — Ambulatory Visit (HOSPITAL_COMMUNITY): Payer: Medicare Other | Attending: Internal Medicine

## 2024-03-12 DIAGNOSIS — I350 Nonrheumatic aortic (valve) stenosis: Secondary | ICD-10-CM | POA: Insufficient documentation

## 2024-03-12 LAB — ECHOCARDIOGRAM COMPLETE
AR max vel: 1.48 cm2
AV Area VTI: 1.29 cm2
AV Area mean vel: 1.3 cm2
AV Mean grad: 22.6 mmHg
AV Peak grad: 38.6 mmHg
Ao pk vel: 3.11 m/s
Area-P 1/2: 3.2 cm2
P 1/2 time: 449 ms
S' Lateral: 2.9 cm

## 2024-03-13 ENCOUNTER — Other Ambulatory Visit: Payer: Self-pay

## 2024-03-13 DIAGNOSIS — I350 Nonrheumatic aortic (valve) stenosis: Secondary | ICD-10-CM

## 2024-03-13 DIAGNOSIS — I351 Nonrheumatic aortic (valve) insufficiency: Secondary | ICD-10-CM

## 2024-03-13 NOTE — Progress Notes (Signed)
  Results:  Mixed Aortic Valve disease (Moderate AS, mild AI)  Plan:  Repeat echo in one year    Christell Constant, MD     Order for echo due in 1 yr placed.

## 2024-05-01 DIAGNOSIS — M109 Gout, unspecified: Secondary | ICD-10-CM | POA: Diagnosis not present

## 2024-05-31 DIAGNOSIS — M109 Gout, unspecified: Secondary | ICD-10-CM | POA: Diagnosis not present

## 2025-03-04 ENCOUNTER — Ambulatory Visit (HOSPITAL_COMMUNITY)
